# Patient Record
Sex: Male | Born: 1980
Health system: Southern US, Community
[De-identification: ages and names within clinical notes are randomized; demographics above are authoritative.]

## PROBLEM LIST (undated history)

## (undated) DIAGNOSIS — R41840 Attention and concentration deficit: Secondary | ICD-10-CM

## (undated) DIAGNOSIS — B009 Herpesviral infection, unspecified: Secondary | ICD-10-CM

## (undated) DIAGNOSIS — M25531 Pain in right wrist: Secondary | ICD-10-CM

## (undated) DIAGNOSIS — E7800 Pure hypercholesterolemia, unspecified: Secondary | ICD-10-CM

## (undated) DIAGNOSIS — M7918 Myalgia, other site: Secondary | ICD-10-CM

## (undated) DIAGNOSIS — I700 Atherosclerosis of aorta: Secondary | ICD-10-CM

## (undated) DIAGNOSIS — N62 Hypertrophy of breast: Secondary | ICD-10-CM

## (undated) DIAGNOSIS — F909 Attention-deficit hyperactivity disorder, unspecified type: Secondary | ICD-10-CM

## (undated) DIAGNOSIS — B351 Tinea unguium: Secondary | ICD-10-CM

## (undated) DIAGNOSIS — K219 Gastro-esophageal reflux disease without esophagitis: Secondary | ICD-10-CM

## (undated) DIAGNOSIS — E669 Obesity, unspecified: Secondary | ICD-10-CM

## (undated) HISTORY — DX: Pure hypercholesterolemia, unspecified: E78.00

## (undated) HISTORY — PX: NO PAST SURGERIES: SHX2092

## (undated) HISTORY — DX: Obesity, unspecified: E66.9

## (undated) HISTORY — DX: Tinea unguium: B35.1

## (undated) HISTORY — DX: Herpesviral infection, unspecified: B00.9

## (undated) HISTORY — DX: Hypertrophy of breast: N62

## (undated) HISTORY — DX: Atherosclerosis of aorta: I70.0

## (undated) HISTORY — DX: Myalgia, other site: M79.18

## (undated) HISTORY — DX: Attention and concentration deficit: R41.840

## (undated) HISTORY — DX: Attention-deficit hyperactivity disorder, unspecified type: F90.9

## (undated) HISTORY — DX: Gastro-esophageal reflux disease without esophagitis: K21.9

---

## 2000-02-17 ENCOUNTER — Emergency Department (HOSPITAL_COMMUNITY): Admission: EM | Admit: 2000-02-17 | Discharge: 2000-02-17 | Payer: Self-pay | Admitting: Emergency Medicine

## 2000-02-17 ENCOUNTER — Encounter: Payer: Self-pay | Admitting: Emergency Medicine

## 2008-03-05 ENCOUNTER — Emergency Department (HOSPITAL_COMMUNITY): Admission: EM | Admit: 2008-03-05 | Discharge: 2008-03-05 | Payer: Self-pay | Admitting: Emergency Medicine

## 2008-03-08 ENCOUNTER — Emergency Department (HOSPITAL_COMMUNITY): Admission: EM | Admit: 2008-03-08 | Discharge: 2008-03-08 | Payer: Self-pay | Admitting: Family Medicine

## 2008-10-11 ENCOUNTER — Emergency Department (HOSPITAL_COMMUNITY): Admission: EM | Admit: 2008-10-11 | Discharge: 2008-10-11 | Payer: Self-pay | Admitting: Emergency Medicine

## 2010-08-25 ENCOUNTER — Emergency Department (HOSPITAL_BASED_OUTPATIENT_CLINIC_OR_DEPARTMENT_OTHER): Admission: EM | Admit: 2010-08-25 | Discharge: 2010-08-26 | Payer: Self-pay | Admitting: Emergency Medicine

## 2010-08-25 ENCOUNTER — Ambulatory Visit: Payer: Self-pay | Admitting: Radiology

## 2011-03-27 NOTE — Op Note (Signed)
[place]  Patient:    Randy Craig, Randy Craig                         MRN: [id number] Proc. Date: 02/17/00 Adm. Date:  [id number] Attending:  Lorre Nick                           Operative Report  PREOPERATIVE DIAGNOSIS:  Laceration of extensor tendons, middle, ring, little finger of right hand.  POSTOPERATIVE DIAGNOSIS:  Laceration of extensor tendons, middle, ring, little finger of right hand.  OPERATION PERFORMED:  Repair of extensor tendons of middle, ring and little fingers.  SURGEON:  Nicki Reaper, M.D.  ASSISTANT:  RN.  ANESTHESIA:  Local.  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  He was seen at the request of the emergency room physician.  Physical examination reveals lacerations.  X-rays reveal several small foreign bodies.  DESCRIPTION OF PROCEDURE:  The patient was prepped and draped using Betadine scrub and solution.  A tourniquet placed on the arm was inflated 250 mmHg. The wound was opened, inspected.  The foreign material was not able to be found.  The extensor tendon lacerations to the middle, ring and little finger were identified.  The stumps were delivered out proximally from the distal aspect and distally from the proximal aspect without removal of the retinacular fibers.  A repair was performed with modified Kessler using 4-0 Mersilene and figure-of-eight epitenon sutures.  The wound was irrigated. Skin closed with interrupted 5-0 nylon sutures.  This was a large laceration covering the entire dorsal aspect down to the ulnar side of his hand and then distally.  A sterile compressive dressing and splint applied.  The patient tolerated the procedure well.  He is discharged home to return to the [facility] in one week on Vicodin and Keflex. DD:   02/17/00 TD:  02/18/00 Job: [id number]

## 2011-08-14 LAB — POCT RAPID STREP A: Streptococcus, Group A Screen (Direct): NEGATIVE

## 2013-05-01 ENCOUNTER — Telehealth: Payer: Self-pay

## 2013-05-01 NOTE — Telephone Encounter (Signed)
Is this WC? If it is please take message in [place]

## 2013-05-01 NOTE — Telephone Encounter (Signed)
Verlee Monte (physical therapist with [facility] called about an order for this pt regarding a specific therapy and medication combination. Based on pts insurance they need a specific order placed/signed. States they faxed this also.  AV:[phone] Fax: [phone]  bf

## 2013-08-02 ENCOUNTER — Other Ambulatory Visit: Payer: Self-pay | Admitting: Interventional Cardiology

## 2013-08-02 DIAGNOSIS — Z79899 Other long term (current) drug therapy: Secondary | ICD-10-CM

## 2013-08-02 DIAGNOSIS — E785 Hyperlipidemia, unspecified: Secondary | ICD-10-CM

## 2013-08-25 ENCOUNTER — Other Ambulatory Visit: Payer: Self-pay | Admitting: Family Medicine

## 2013-08-25 DIAGNOSIS — N6320 Unspecified lump in the left breast, unspecified quadrant: Secondary | ICD-10-CM

## 2013-09-06 ENCOUNTER — Other Ambulatory Visit: Payer: Self-pay

## 2013-09-09 DIAGNOSIS — M25531 Pain in right wrist: Secondary | ICD-10-CM

## 2013-09-09 HISTORY — DX: Pain in right wrist: M25.531

## 2013-09-22 ENCOUNTER — Other Ambulatory Visit: Payer: Self-pay

## 2013-10-02 ENCOUNTER — Other Ambulatory Visit: Payer: Self-pay | Admitting: Orthopedic Surgery

## 2013-10-02 ENCOUNTER — Encounter (HOSPITAL_BASED_OUTPATIENT_CLINIC_OR_DEPARTMENT_OTHER): Payer: Self-pay | Admitting: *Deleted

## 2013-10-04 NOTE — H&P (Signed)
Randy Craig is an 32 y.o. male.   CC / Reason for Visit: Right wrist pain HPI: This patient returns for reevaluation, having last been evaluated on 08-03-13 and having undergone MRI arthrogram of the right wrist on 08-30-13.  The MRI arthrogram has some irregularity at the TFCC, although frank tear is not evident that there is no extravasation of contrast through the TFCC.  There may be a small central tear of the scapholunate ligament however.  He reports that his symptoms are back to the level that they were prior to injection and that he relies upon his brace intermittently.  Presenting history follows: This patient is a 32 year old male who presents for evaluation of his right wrist.  He reports that he was working with a resident, when the resident became angry and slammed the door and the patient's face.  He reports putting hand up to block the door causing his wrist to become hyperextended.  He reports at that time he had some pain along the thumb and the ulnar side of the wrist, and the thumb pain has resolved.  He had x-rays obtained and initial evaluation that day.  He has treated this nonoperatively and undergone therapy but continues to have ulnar-sided pain.  I did ask him about a scar on the dorsal ulnar aspect of the hand and he reports that he had an injury in 2001 that created a laceration and required some extensor tendon repair.  He is done well in the interim by his report.  Past Medical History  Diagnosis Date  . Right wrist pain 09/2013    Past Surgical History  Procedure Laterality Date  . No past surgeries      History reviewed. No pertinent family history. Social History:  reports that he has never smoked. He has never used smokeless tobacco. He reports that he drinks alcohol. He reports that he does not use illicit drugs.  Allergies: No Known Allergies  No prescriptions prior to admission    No results found for this or any previous visit (from the past 48  hour(s)). No results found.  Review of Systems  All other systems reviewed and are negative.    Height 6\' 3"  (1.905 m), weight 127.007 kg (280 lb). Physical Exam  Constitutional:  WD, WN, NAD HEENT:  NCAT, EOMI Neuro/Psych:  Alert & oriented to person, place, and time; appropriate mood & affect Lymphatic: No generalized UE edema or lymphadenopathy Extremities / MSK:  Both UE are normal with respect to appearance, ranges of motion, joint stability, muscle strength/tone, sensation, & perfusion except as otherwise noted:  There is a well-healed laceration that traverses the dorsal ulnar aspect of the hand near the junction with the carpus and around the ulnar side.  He has full extension of all the digits to include EDQ function.  Maximal tenderness today is just distal to the distal ulna.  LT ballottement does not cause pain.  No other areas of tenderness, and no pain specifically along the ECU.   Labs / Xrays:  No radiographic studies obtained today.  Assessment: Left wrist pain, possibly of TFCC origin, but also with scapholunate ligament abnormalities on MRI arthrogram  Plan:  I discussed these findings with him.  He confirms with me that his problems are sufficient for him to consider operative intervention.  I recommend proceeding with right wrist arthroscopy to further define the pathology, with subsequent debridement versus repair of structures as indicated.  We will take a good look at the scapholunate  ligament as well as the TFCC.  I anticipate finding TFCC pathology.  We will plan to proceed once authorization has been received from [employer].  Brady Schiller A. 10/04/2013, 3:27 PM

## 2013-10-09 ENCOUNTER — Encounter (HOSPITAL_BASED_OUTPATIENT_CLINIC_OR_DEPARTMENT_OTHER): Payer: Self-pay

## 2013-10-09 ENCOUNTER — Ambulatory Visit (HOSPITAL_BASED_OUTPATIENT_CLINIC_OR_DEPARTMENT_OTHER)
Admission: RE | Admit: 2013-10-09 | Discharge: 2013-10-09 | Disposition: A | Discharge status: Home / Self Care | Payer: Worker's Compensation | Source: Ambulatory Visit | Attending: Orthopedic Surgery | Admitting: Orthopedic Surgery

## 2013-10-09 ENCOUNTER — Encounter (HOSPITAL_BASED_OUTPATIENT_CLINIC_OR_DEPARTMENT_OTHER)
Admission: RE | Disposition: A | Discharge status: Home / Self Care | Payer: Self-pay | Source: Ambulatory Visit | Attending: Orthopedic Surgery

## 2013-10-09 ENCOUNTER — Ambulatory Visit (HOSPITAL_BASED_OUTPATIENT_CLINIC_OR_DEPARTMENT_OTHER): Payer: Worker's Compensation | Admitting: Certified Registered Nurse Anesthetist

## 2013-10-09 ENCOUNTER — Encounter (HOSPITAL_BASED_OUTPATIENT_CLINIC_OR_DEPARTMENT_OTHER): Payer: Worker's Compensation | Admitting: Certified Registered Nurse Anesthetist

## 2013-10-09 DIAGNOSIS — M249 Joint derangement, unspecified: Secondary | ICD-10-CM | POA: Insufficient documentation

## 2013-10-09 HISTORY — PX: WRIST ARTHROSCOPY WITH FOVEAL TRIANGULAR FIBROCARTILAGE COMPLEX REPAIR: SHX6403

## 2013-10-09 HISTORY — DX: Pain in right wrist: M25.531

## 2013-10-09 LAB — POCT HEMOGLOBIN-HEMACUE: Hemoglobin: 15.3 g/dL (ref 13.0–17.0)

## 2013-10-09 SURGERY — WRIST ARTHROSCOPY WITH FOVEAL TRIANGULAR FIBROCARTILAGE COMPLEX REPAIR
Anesthesia: General | Site: Wrist | Laterality: Left | Wound class: Clean

## 2013-10-09 MED ORDER — OXYCODONE-ACETAMINOPHEN 5-325 MG PO TABS: 1.0000 | ORAL_TABLET | ORAL | Status: DC | PRN

## 2013-10-09 MED ORDER — CEFAZOLIN SODIUM-DEXTROSE 2-3 GM-% IV SOLR
2.0000 g | INTRAVENOUS | Status: AC
  Administered 2013-10-09: 2 g via INTRAVENOUS

## 2013-10-09 MED ORDER — FENTANYL CITRATE 0.05 MG/ML IJ SOLN
50.0000 ug | INTRAMUSCULAR | Status: DC | PRN
  Administered 2013-10-09: 100 ug via INTRAVENOUS

## 2013-10-09 MED ORDER — MIDAZOLAM HCL 2 MG/2ML IJ SOLN
INTRAMUSCULAR | Status: AC
  Filled 2013-10-09: qty 2

## 2013-10-09 MED ORDER — CEFAZOLIN SODIUM-DEXTROSE 2-3 GM-% IV SOLR
INTRAVENOUS | Status: AC
  Filled 2013-10-09: qty 50

## 2013-10-09 MED ORDER — HYDROMORPHONE HCL PF 1 MG/ML IJ SOLN
INTRAMUSCULAR | Status: AC
  Filled 2013-10-09: qty 1

## 2013-10-09 MED ORDER — BUPIVACAINE-EPINEPHRINE 0.5% -1:200000 IJ SOLN
INTRAMUSCULAR | Status: DC | PRN
  Administered 2013-10-09: 10 mL

## 2013-10-09 MED ORDER — MIDAZOLAM HCL 5 MG/5ML IJ SOLN
INTRAMUSCULAR | Status: DC | PRN
  Administered 2013-10-09: 2 mg via INTRAVENOUS

## 2013-10-09 MED ORDER — ONDANSETRON HCL 4 MG/2ML IJ SOLN
INTRAMUSCULAR | Status: DC | PRN
  Administered 2013-10-09: 4 mg via INTRAVENOUS

## 2013-10-09 MED ORDER — FENTANYL CITRATE 0.05 MG/ML IJ SOLN
INTRAMUSCULAR | Status: AC
  Filled 2013-10-09: qty 6

## 2013-10-09 MED ORDER — OXYCODONE HCL 5 MG PO TABS
ORAL_TABLET | ORAL | Status: AC
  Filled 2013-10-09: qty 1

## 2013-10-09 MED ORDER — LACTATED RINGERS IV SOLN: INTRAVENOUS | Status: DC

## 2013-10-09 MED ORDER — CHLORHEXIDINE GLUCONATE 4 % EX LIQD: 60.0000 mL | Freq: Once | CUTANEOUS | Status: DC

## 2013-10-09 MED ORDER — LIDOCAINE HCL (CARDIAC) 20 MG/ML IV SOLN
INTRAVENOUS | Status: DC | PRN
  Administered 2013-10-09: 60 mg via INTRAVENOUS

## 2013-10-09 MED ORDER — HYDROMORPHONE HCL PF 1 MG/ML IJ SOLN
0.2500 mg | INTRAMUSCULAR | Status: DC | PRN
  Administered 2013-10-09 (×3): 0.5 mg via INTRAVENOUS

## 2013-10-09 MED ORDER — LACTATED RINGERS IV SOLN
INTRAVENOUS | Status: DC
  Administered 2013-10-09 (×2): via INTRAVENOUS

## 2013-10-09 MED ORDER — MIDAZOLAM HCL 2 MG/ML PO SYRP: 12.0000 mg | ORAL_SOLUTION | Freq: Once | ORAL | Status: DC | PRN

## 2013-10-09 MED ORDER — PROPOFOL 10 MG/ML IV BOLUS
INTRAVENOUS | Status: DC | PRN
  Administered 2013-10-09: 260 mg via INTRAVENOUS

## 2013-10-09 MED ORDER — OXYCODONE HCL 5 MG PO TABS
5.0000 mg | ORAL_TABLET | Freq: Once | ORAL | Status: AC | PRN
  Administered 2013-10-09: 5 mg via ORAL

## 2013-10-09 MED ORDER — FENTANYL CITRATE 0.05 MG/ML IJ SOLN
INTRAMUSCULAR | Status: DC | PRN
  Administered 2013-10-09 (×2): 50 ug via INTRAVENOUS
  Administered 2013-10-09: 100 ug via INTRAVENOUS
  Administered 2013-10-09 (×2): 50 ug via INTRAVENOUS

## 2013-10-09 MED ORDER — OXYCODONE HCL 5 MG/5ML PO SOLN: 5.0000 mg | Freq: Once | ORAL | Status: AC | PRN

## 2013-10-09 MED ORDER — ONDANSETRON HCL 4 MG/2ML IJ SOLN: 4.0000 mg | Freq: Once | INTRAMUSCULAR | Status: DC | PRN

## 2013-10-09 MED ORDER — MIDAZOLAM HCL 2 MG/2ML IJ SOLN
1.0000 mg | INTRAMUSCULAR | Status: DC | PRN
  Administered 2013-10-09: 2 mg via INTRAVENOUS

## 2013-10-09 MED ORDER — PROPOFOL 10 MG/ML IV BOLUS
INTRAVENOUS | Status: AC
  Filled 2013-10-09: qty 20

## 2013-10-09 MED ORDER — FENTANYL CITRATE 0.05 MG/ML IJ SOLN
INTRAMUSCULAR | Status: AC
  Filled 2013-10-09: qty 2

## 2013-10-09 MED ORDER — FENTANYL CITRATE 0.05 MG/ML IJ SOLN
INTRAMUSCULAR | Status: AC
  Filled 2013-10-09: qty 4

## 2013-10-09 MED ORDER — DEXAMETHASONE SODIUM PHOSPHATE 10 MG/ML IJ SOLN
INTRAMUSCULAR | Status: DC | PRN
  Administered 2013-10-09: 10 mg via INTRAVENOUS

## 2013-10-09 SURGICAL SUPPLY — 67 items
ADH SKN CLS APL DERMABOND .7 (GAUZE/BANDAGES/DRESSINGS)
APL SKNCLS STERI-STRIP NONHPOA (GAUZE/BANDAGES/DRESSINGS)
BANDAGE GAUZE ELAST BULKY 4 IN (GAUZE/BANDAGES/DRESSINGS) ×4 IMPLANT
BENZOIN TINCTURE PRP APPL 2/3 (GAUZE/BANDAGES/DRESSINGS) IMPLANT
BLADE MINI RND TIP GREEN BEAV (BLADE) IMPLANT
BLADE SURG 15 STRL LF DISP TIS (BLADE) ×1 IMPLANT
BLADE SURG 15 STRL SS (BLADE) ×2
BNDG CMPR 9X4 STRL LF SNTH (GAUZE/BANDAGES/DRESSINGS) ×1
BNDG COHESIVE 4X5 TAN STRL (GAUZE/BANDAGES/DRESSINGS) ×2 IMPLANT
BNDG ESMARK 4X9 LF (GAUZE/BANDAGES/DRESSINGS) ×2 IMPLANT
BUR CUDA 2.9 (BURR) IMPLANT
BUR FULL RADIUS 2.0 (BURR) IMPLANT
BUR FULL RADIUS 2.9 (BURR) IMPLANT
BUR GATOR 2.9 (BURR) ×1 IMPLANT
CANISTER SUCT 1200ML W/VALVE (MISCELLANEOUS) ×2 IMPLANT
CANISTER SUCT 3000ML (MISCELLANEOUS) ×1 IMPLANT
CANISTER SUCT LVC 12 LTR MEDI- (MISCELLANEOUS) IMPLANT
CHLORAPREP W/TINT 26ML (MISCELLANEOUS) ×2 IMPLANT
CORDS BIPOLAR (ELECTRODE) IMPLANT
COVER MAYO STAND STRL (DRAPES) ×1 IMPLANT
COVER TABLE BACK 60X90 (DRAPES) ×2 IMPLANT
CUFF TOURNIQUET SINGLE 18IN (TOURNIQUET CUFF) ×1 IMPLANT
DECANTER SPIKE VIAL GLASS SM (MISCELLANEOUS) IMPLANT
DERMABOND ADVANCED (GAUZE/BANDAGES/DRESSINGS)
DERMABOND ADVANCED .7 DNX12 (GAUZE/BANDAGES/DRESSINGS) IMPLANT
DRAPE EXTREMITY T 121X128X90 (DRAPE) ×2 IMPLANT
DRAPE OEC MINIVIEW 54X84 (DRAPES) IMPLANT
DRAPE SURG 17X23 STRL (DRAPES) ×2 IMPLANT
DRSG EMULSION OIL 3X3 NADH (GAUZE/BANDAGES/DRESSINGS) ×2 IMPLANT
DRSG TEGADERM 4X4.75 (GAUZE/BANDAGES/DRESSINGS) IMPLANT
GLOVE BIO SURGEON STRL SZ7.5 (GLOVE) ×3 IMPLANT
GLOVE BIOGEL PI IND STRL 7.0 (GLOVE) IMPLANT
GLOVE BIOGEL PI IND STRL 8 (GLOVE) ×1 IMPLANT
GLOVE BIOGEL PI INDICATOR 7.0 (GLOVE) ×1
GLOVE BIOGEL PI INDICATOR 8 (GLOVE) ×1
GLOVE ECLIPSE 6.5 STRL STRAW (GLOVE) ×1 IMPLANT
GOWN PREVENTION PLUS XLARGE (GOWN DISPOSABLE) ×4 IMPLANT
NEEDLE HYPO 22GX1.5 SAFETY (NEEDLE) ×2 IMPLANT
PACK BASIN DAY SURGERY FS (CUSTOM PROCEDURE TRAY) ×2 IMPLANT
PADDING CAST ABS 4INX4YD NS (CAST SUPPLIES) ×1
PADDING CAST ABS COTTON 4X4 ST (CAST SUPPLIES) ×1 IMPLANT
PASSER SUT SWANSON 36MM LOOP (INSTRUMENTS) IMPLANT
SET SM JOINT TUBING/CANN (CANNULA) ×2 IMPLANT
SHEET MEDIUM DRAPE 40X70 STRL (DRAPES) ×1 IMPLANT
SLING ARM FOAM STRAP LRG (SOFTGOODS) IMPLANT
SLING ARM FOAM STRAP MED (SOFTGOODS) IMPLANT
SLING ARM FOAM STRAP SML (SOFTGOODS) IMPLANT
SLING ARM FOAM STRAP XLG (SOFTGOODS) IMPLANT
SPONGE GAUZE 4X4 12PLY (GAUZE/BANDAGES/DRESSINGS) ×2 IMPLANT
STOCKINETTE 4X48 STRL (DRAPES) ×2 IMPLANT
STRIP CLOSURE SKIN 1/2X4 (GAUZE/BANDAGES/DRESSINGS) IMPLANT
SUT ETHIBOND 2 OS 4 DA (SUTURE) IMPLANT
SUT LASSO 70 DEG MICRO SHORT (SUTURE) ×2
SUT LASSO MICRO STR (SUTURE) ×1 IMPLANT
SUT PDS AB 0 CT 36 (SUTURE) ×2 IMPLANT
SUT VICRYL 4-0 PS2 18IN ABS (SUTURE) ×1 IMPLANT
SUT VICRYL RAPIDE 4/0 PS 2 (SUTURE) IMPLANT
SUTURE LASSO 70 DEG MICR SHORT (SUTURE) IMPLANT
SYR BULB 3OZ (MISCELLANEOUS) IMPLANT
SYR CONTROL 10ML LL (SYRINGE) ×1 IMPLANT
SYRINGE 10CC LL (SYRINGE) IMPLANT
TOWEL OR 17X24 6PK STRL BLUE (TOWEL DISPOSABLE) ×2 IMPLANT
TOWEL OR NON WOVEN STRL DISP B (DISPOSABLE) ×2 IMPLANT
TRAP DIGIT (INSTRUMENTS) ×1 IMPLANT
TRAP FINGER LRG (INSTRUMENTS) IMPLANT
TUBE CONNECTING 20X1/4 (TUBING) ×2 IMPLANT
WAND 1.5 MICROBLATOR (SURGICAL WAND) IMPLANT

## 2013-10-09 NOTE — Anesthesia Procedure Notes (Signed)
Procedure Name: LMA Insertion Date/Time: 10/09/2013 10:21 AM Performed by: Janee Morn, DAVID A Pre-anesthesia Checklist: Patient identified, Emergency Drugs available, Suction available and Patient being monitored Patient Re-evaluated:Patient Re-evaluated prior to inductionOxygen Delivery Method: Circle System Utilized Preoxygenation: Pre-oxygenation with 100% oxygen Intubation Type: IV induction Ventilation: Mask ventilation without difficulty LMA: LMA inserted LMA Size: 5.0 Number of attempts: 1 Placement Confirmation: positive ETCO2 Tube secured with: Tape Dental Injury: Teeth and Oropharynx as per pre-operative assessment

## 2013-10-09 NOTE — Anesthesia Preprocedure Evaluation (Signed)
Anesthesia Evaluation  Patient identified by MRN, date of birth, ID band Patient awake    Reviewed: Allergy & Precautions, H&P , NPO status , Patient's Chart, lab work & pertinent test results  Airway Mallampati: I      Dental  (+) Teeth Intact and Dental Advisory Given   Pulmonary  breath sounds clear to auscultation        Cardiovascular Rhythm:Regular Rate:Normal     Neuro/Psych    GI/Hepatic   Endo/Other    Renal/GU      Musculoskeletal   Abdominal   Peds  Hematology   Anesthesia Other Findings   Reproductive/Obstetrics                           Anesthesia Physical Anesthesia Plan  ASA: I  Anesthesia Plan: General   Post-op Pain Management:    Induction: Intravenous  Airway Management Planned: Oral ETT  Additional Equipment:   Intra-op Plan:   Post-operative Plan: Extubation in OR  Informed Consent: I have reviewed the patients History and Physical, chart, labs and discussed the procedure including the risks, benefits and alternatives for the proposed anesthesia with the patient or authorized representative who has indicated his/her understanding and acceptance.   Dental advisory given  Plan Discussed with: CRNA, Anesthesiologist and Surgeon  Anesthesia Plan Comments:         Anesthesia Quick Evaluation

## 2013-10-09 NOTE — Op Note (Signed)
10/09/2013  9:59 AM  PATIENT:  Randy Craig  32 y.o. 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 Janee Morn, MD  PHYSICIAN ASSISTANT: None  ANESTHESIA:  general  SPECIMENS:  None  DRAINS:   None  PREOPERATIVE INDICATIONS:  Randy Craig is a  32 y.o. male with a diagnosis of right wrist pain who failed conservative measures and elected for surgical management.    The risks benefits and alternatives were discussed with the patient preoperatively including but not limited to the risks of infection, bleeding, nerve injury, cardiopulmonary complications, the need for revision surgery, among others, and the patient verbalized understanding and consented to proceed.  OPERATIVE IMPLANTS: None  OPERATIVE PROCEDURE:  After receiving prophylactic antibiotics, the patient was escorted to the operative theatre and placed in a supine position.  General anesthesia was administered A surgical "time-out" was performed during which the planned procedure, proposed operative site, and the correct patient identity were compared to the operative consent and agreement confirmed by the circulating nurse according to current facility policy.  Following application of a tourniquet to the operative extremity, the exposed skin was prepped with Chloraprep and draped in the usual sterile fashion.  The limb was exsanguinated with an Esmarch bandage and the tourniquet inflated to approximately higher than systolic BP.  Right upper extremity was positioned in the Linvatec traction tower and the portals marked after 10-12 pounds of traction was applied. The 3-4 portal was established first with 15 blade,  followed by a mosquito clamp and the blunt obturator. Visualization was good. The cartilage of the proximal row was unremarkable. Cartilage of the distal radius had no lesions. The central portion of the scapholunate ligament was found to have been torn and disrupted with intact volar and dorsal components. A dorsal peripheral TFCC tear was also identified after establishment of a 6R portal and use of a probe. There was synovitis in the prestyloid recess and along the dorsal peripheral capsule. The scope was then positioned into the 6R portal and used for visualization while the suction shaver was introduced into the 3-4 portal and the central portion of the scapholunate ligament debrided. The radial midcarpal joint was then established and the camera inserted here. There was no significant gapping of the scapholunate joint and the tip of the probe could not be passed into the joint, after establishment of the ulnar midcarpal portal. Attention was directed back to the radiocarpal joint for visualization was maintained through the 3-4 portal and a 6R portal was elongated with care to protect the dorsal cutaneous nerve distally. The fifth compartment was opened and an [employer] TFC repair kit used to pass 2 simple sutures through the TFCC and tied down over the dorsal capsule. The fifth compartment was then closed with 3-0 Vicryl interrupted sutures allowing fifth compartment tendon to remain extra compartmental distally and within screws proximally. Tourniquet was released and additional hemostasis and necessary and the portals anesthetized with quarter percent Marcaine with epinephrine. All the incisions were closed with 4-0 Vicryl Rapide interrupted sutures, running horizontal mattress for the longer incision and a sugar tong splint dressing applied with the forearm semisupinated. He was awakened and taken to recovery room in stable condition.  DISPOSITION: Patient will be discharged home today  with typical  postop instructions returning in 10-15 days for reevaluation and with a follow-on appointment with hand therapy to have a long-arm splint constructed to keep the forearm semisupinated initially as he embarked upon a Advertising account executive protocol.

## 2013-10-09 NOTE — Progress Notes (Signed)
Assisted Dr. Crews with right, ultrasound guided, supraclavicular block. Side rails up, monitors on throughout procedure. See vital signs in flow sheet. Tolerated Procedure well. 

## 2013-10-09 NOTE — Interval H&P Note (Signed)
History and Physical Interval Note:  10/09/2013 9:58 AM  Randy Craig  has presented today for surgery, with the diagnosis of RIGHT WRIST PAIN   The various methods of treatment have been discussed with the patient and family. After consideration of risks, benefits and other options for treatment, the patient has consented to  Procedure(s): RIGHT WRIST ARTHROSCOPY POSSIBLE LIGAMENT REPAIR     (Left) as a surgical intervention .  The patient's history has been reviewed, patient examined, no change in status, stable for surgery.  I have reviewed the patient's chart and labs.  Questions were answered to the patient's satisfaction.     Hussam Muniz A.

## 2013-10-09 NOTE — Anesthesia Postprocedure Evaluation (Signed)
  Anesthesia Post-op Note  Patient: Randy Craig  Procedure(s) Performed: Procedure(s): RIGHT WRIST ARTHROSCOPY TFCC REPAIR     (Left)  Patient Location: PACU  Anesthesia Type:General  Level of Consciousness: awake, alert  and oriented  Airway and Oxygen Therapy: Patient Spontanous Breathing  Post-op Pain: mild  Post-op Assessment: Post-op Vital signs reviewed  Post-op Vital Signs: Reviewed  Complications: No apparent anesthesia complications

## 2013-10-09 NOTE — Transfer of Care (Signed)
Immediate Anesthesia Transfer of Care Note  Patient: Randy Craig  Procedure(s) Performed: Procedure(s): RIGHT WRIST ARTHROSCOPY TFCC REPAIR     (Left)  Patient Location: PACU  Anesthesia Type:General  Level of Consciousness: awake and patient cooperative  Airway & Oxygen Therapy: Patient Spontanous Breathing and Patient connected to face mask oxygen  Post-op Assessment: Report given to PACU RN and Post -op Vital signs reviewed and stable  Post vital signs: Reviewed and stable  Complications: No apparent anesthesia complications

## 2013-10-10 ENCOUNTER — Encounter (HOSPITAL_BASED_OUTPATIENT_CLINIC_OR_DEPARTMENT_OTHER): Payer: Self-pay | Admitting: Orthopedic Surgery

## 2014-05-21 ENCOUNTER — Ambulatory Visit (INDEPENDENT_AMBULATORY_CARE_PROVIDER_SITE_OTHER): Payer: BC Managed Care – PPO | Admitting: Surgery

## 2014-05-21 ENCOUNTER — Encounter (INDEPENDENT_AMBULATORY_CARE_PROVIDER_SITE_OTHER): Payer: Self-pay | Admitting: Surgery

## 2014-05-21 VITALS — BP 122/80 | HR 68 | Resp 14 | Ht 75.0 in | Wt 276.8 lb

## 2014-05-21 DIAGNOSIS — K4090 Unilateral inguinal hernia, without obstruction or gangrene, not specified as recurrent: Secondary | ICD-10-CM

## 2014-05-21 NOTE — Patient Instructions (Signed)
Hernia Repair Care After These instructions give you information on caring for yourself after your procedure. Your doctor may also give you more specific instructions. Call your doctor if you have any problems or questions after your procedure. HOME CARE   You may have changes in your poops (bowel movements).  You may have loose or watery poop (diarrhea).  You may be not able to poop.  Your bowels will slowly get back to normal.  Do not eat any food that makes you sick to your stomach (nauseous). Eat small meals 4 to 6 times a day instead of 3 large ones.  Do not drink pop. It will give you gas.  Do not drink alcohol.  Do not lift anything heavier than 10 pounds. This is about the weight of a gallon of milk.  Do not do anything that makes you very tired for at least 6 weeks.  Do not get your wound wet for 2 days.  You may take a sponge bath during this time.  After 2 days you may take a shower. Gently pat your surgical cut (incision) dry with a towel. Do not rub it.  For men: You may have been given an athletic supporter (scrotal support) before you left the hospital. It holds your scrotum and testicles closer to your body so there is no strain on your wound. Wear the supporter until your doctor tells you that you do not need it anymore. GET HELP RIGHT AWAY IF:  You have watery poop, or cannot poop for more than 3 days.  You feel sick to your stomach or throw up (vomit) more than 2 or 3 times.  You have temperature by mouth above 102 F (38.9 C).  You see redness or puffiness (swelling) around your wound.  You see yellowish white fluid (pus) coming from your wound.  You see a bulge or bump in your lower belly (abdomen) or near your groin.  You develop a rash, trouble breathing, or any other symptoms from medicines taken. MAKE SURE YOU:  Understand these instructions.  Will watch your condition.  Will get help right away if your are not doing well or get  worse. Document Released: 10/08/2008 Document Revised: 01/18/2012 Document Reviewed: 10/08/2008 [facility] Patient Information 2015 [place], [place]. This information is not intended to replace advice given to you by your health care provider. Make sure you discuss any questions you have with your health care provider. Inguinal Hernia, Adult Muscles help keep everything in the body in its proper place. But if a weak spot in the muscles develops, something can poke through. That is called a hernia. When this happens in the lower part of the belly (abdomen), it is called an inguinal hernia. (It takes its name from a part of the body in this region called the inguinal canal.) A weak spot in the wall of muscles lets some fat or part of the small intestine bulge through. An inguinal hernia can develop at any age. Men get them more often than women. CAUSES  In adults, an inguinal hernia develops over time.  It can be triggered by:  Suddenly straining the muscles of the lower abdomen.  Lifting heavy objects.  Straining to have a bowel movement. Difficult bowel movements (constipation) can lead to this.  Constant coughing. This may be caused by smoking or lung disease.  Being overweight.  Being pregnant.  Working at a job that requires long periods of standing or heavy lifting.  Having had an inguinal hernia before.  One type can be an emergency situation. It is called a strangulated inguinal hernia. It develops if part of the small intestine slips through the weak spot and cannot get back into the abdomen. The blood supply can be cut off. If that happens, part of the intestine may die. This situation requires emergency surgery. SYMPTOMS  Often, a small inguinal hernia has no symptoms. It is found when a healthcare provider does a physical exam. Larger hernias usually have symptoms.   In adults, symptoms may include:  A lump in the groin. This is easier to see when the person is standing. It might  disappear when lying down.  In men, a lump in the scrotum.  Pain or burning in the groin. This occurs especially when lifting, straining or coughing.  A dull ache or feeling of pressure in the groin.  Signs of a strangulated hernia can include:  A bulge in the groin that becomes very painful and tender to the touch.  A bulge that turns red or purple.  Fever, nausea and vomiting.  Inability to have a bowel movement or to pass gas. DIAGNOSIS  To decide if you have an inguinal hernia, a healthcare provider will probably do a physical examination.  This will include asking questions about any symptoms you have noticed.  The healthcare provider might feel the groin area and ask you to cough. If an inguinal hernia is felt, the healthcare provider may try to slide it back into the abdomen.  Usually no other tests are needed. TREATMENT  Treatments can vary. The size of the hernia makes a difference. Options include:  Watchful waiting. This is often suggested if the hernia is small and you have had no symptoms.  No medical procedure will be done unless symptoms develop.  You will need to watch closely for symptoms. If any occur, contact your healthcare provider right away.  Surgery. This is used if the hernia is larger or you have symptoms.  Open surgery. This is usually an outpatient procedure (you will not stay overnight in a hospital). An cut (incision) is made through the skin in the groin. The hernia is put back inside the abdomen. The weak area in the muscles is then repaired by herniorrhaphy or hernioplasty. Herniorrhaphy: in this type of surgery, the weak muscles are sewn back together. Hernioplasty: a patch or mesh is used to close the weak area in the abdominal wall.  Laparoscopy. In this procedure, a surgeon makes small incisions. A thin tube with a tiny video camera (called a laparoscope) is put into the abdomen. The surgeon repairs the hernia with mesh by looking with the  video camera and using two long instruments. HOME CARE INSTRUCTIONS   After surgery to repair an inguinal hernia:  You will need to take pain medicine prescribed by your healthcare provider. Follow all directions carefully.  You will need to take care of the wound from the incision.  Your activity will be restricted for awhile. This will probably include no heavy lifting for several weeks. You also should not do anything too active for a few weeks. When you can return to work will depend on the type of job that you have.  During "watchful waiting" periods, you should:  Maintain a healthy weight.  Eat a diet high in fiber (fruits, vegetables and whole grains).  Drink plenty of fluids to avoid constipation. This means drinking enough water and other liquids to keep your urine clear or pale yellow.  Do not lift  heavy objects.  Do not stand for long periods of time.  Quit smoking. This should keep you from developing a frequent cough. SEEK MEDICAL CARE IF:   A bulge develops in your groin area.  You feel pain, a burning sensation or pressure in the groin. This might be worse if you are lifting or straining.  You develop a fever of more than 100.5 F (38.1 C). SEEK IMMEDIATE MEDICAL CARE IF:   Pain in the groin increases suddenly.  A bulge in the groin gets bigger suddenly and does not go down.  For men, there is sudden pain in the scrotum. Or, the size of the scrotum increases.  A bulge in the groin area becomes red or purple and is painful to touch.  You have nausea or vomiting that does not go away.  You feel your heart beating much faster than normal.  You cannot have a bowel movement or pass gas.  You develop a fever of more than 102.0 F (38.9 C). Document Released: 03/14/2009 Document Revised: 01/18/2012 Document Reviewed: 03/14/2009 [facility] Patient Information 2015 [place], [place]. This information is not intended to replace advice given to you by your health  care provider. Make sure you discuss any questions you have with your health care provider.

## 2014-05-21 NOTE — Progress Notes (Signed)
Patient ID: Randy Craig, male   DOB: 11/22/1980, 33 y.o.   MRN: [id number]  No chief complaint on file.   HPI Randy Craig is a 33 y.o. male.  Pt sent at the request of  Dr Foy GuadalajaraFried for left inguinal hernia.  Pt has a bulge and pain with exertion fo r2 months.  Rest makes it better.  HPI  Past Medical History  Diagnosis Date  . Right wrist pain 09/2013    Past Surgical History  Procedure Laterality Date  . No past surgeries    . Wrist arthroscopy with foveal triangular fibrocartilage complex repair Left 10/09/2013    Procedure: RIGHT WRIST ARTHROSCOPY TFCC REPAIR    ;  Surgeon: Jodi Marbleavid A Thompson, MD;  Location: [facility];  Service: Orthopedics;  Laterality: Left;    No family history on file.  Social History History  Substance Use Topics  . Smoking status: Never Smoker   . Smokeless tobacco: Never Used  . Alcohol Use: Yes     Comment: occasionally    No Known Allergies  Current Outpatient Prescriptions  Medication Sig Dispense Refill  . naproxen (NAPROSYN) 250 MG tablet Take by mouth 2 (two) times daily with a meal.       No current facility-administered medications for this visit.    Review of Systems Review of Systems  Constitutional: Negative for fever, chills and unexpected weight change.  HENT: Negative for congestion, hearing loss, sore throat, trouble swallowing and voice change.   Eyes: Negative for visual disturbance.  Respiratory: Negative for cough and wheezing.   Cardiovascular: Negative for chest pain, palpitations and leg swelling.  Gastrointestinal: Negative for nausea, vomiting, abdominal pain, diarrhea, constipation, blood in stool, abdominal distention, anal bleeding and rectal pain.  Genitourinary: Negative for hematuria and difficulty urinating.  Musculoskeletal: Negative for arthralgias.  Skin: Negative for rash and wound.  Neurological: Negative for seizures, syncope, weakness and headaches.  Hematological: Negative for adenopathy.  Does not bruise/bleed easily.  Psychiatric/Behavioral: Negative for confusion.    Blood pressure 122/80, pulse 68, resp. rate 14, height 6\' 3"  (1.905 m), weight 276 lb 12.8 oz (125.556 kg).  Physical Exam Physical Exam  Constitutional: He is oriented to person, place, and time. He appears well-developed and well-nourished.  HENT:  Head: Normocephalic and atraumatic.  Eyes: Pupils are equal, round, and reactive to light. No scleral icterus.  Neck: Normal range of motion. Neck supple.  Cardiovascular: Normal rate and regular rhythm.   Pulmonary/Chest: Effort normal and breath sounds normal.  Abdominal: Soft. There is no tenderness. A hernia is present. Hernia confirmed positive in the left inguinal area.  Musculoskeletal: Normal range of motion.  Neurological: He is alert and oriented to person, place, and time.  Skin: Skin is warm and dry.  Psychiatric: He has a normal mood and affect. His behavior is normal. Judgment and thought content normal.    Data Reviewed none  Assessment    Left inguinal hernia    Plan    Recommend repair left inguinal hernia with mesh.The risk of hernia repair include bleeding,  Infection,   Recurrence of the hernia,  Mesh use, chronic pain,  Organ injury,  Bowel injury,  Bladder injury,   nerve injury with numbness around the incision,  Death,  and worsening of preexisting  medical problems.  The alternatives to surgery have been discussed as well..  Long term expectations of both operative and non operative treatments have been discussed.   The patient agrees to proceed.  Stan Cantave A. 05/21/2014, 5:44 PM

## 2014-06-12 ENCOUNTER — Other Ambulatory Visit (INDEPENDENT_AMBULATORY_CARE_PROVIDER_SITE_OTHER): Payer: Self-pay

## 2014-06-12 DIAGNOSIS — K4090 Unilateral inguinal hernia, without obstruction or gangrene, not specified as recurrent: Secondary | ICD-10-CM

## 2014-06-12 MED ORDER — OXYCODONE-ACETAMINOPHEN 5-325 MG PO TABS: 1.0000 | ORAL_TABLET | ORAL | Status: DC | PRN

## 2014-06-12 MED ORDER — TRAMADOL HCL 50 MG PO TABS: 50.0000 mg | ORAL_TABLET | Freq: Four times a day (QID) | ORAL | Status: DC | PRN

## 2014-06-29 ENCOUNTER — Ambulatory Visit (INDEPENDENT_AMBULATORY_CARE_PROVIDER_SITE_OTHER): Payer: BC Managed Care – PPO | Admitting: Surgery

## 2014-06-29 ENCOUNTER — Encounter (INDEPENDENT_AMBULATORY_CARE_PROVIDER_SITE_OTHER): Payer: Self-pay | Admitting: Surgery

## 2014-06-29 VITALS — BP 130/78 | HR 77 | Temp 97.0°F | Ht 75.0 in | Wt 268.8 lb

## 2014-06-29 DIAGNOSIS — Z9889 Other specified postprocedural states: Secondary | ICD-10-CM

## 2014-06-29 NOTE — Patient Instructions (Signed)
Resume full activity in 2 weeks.  

## 2014-06-29 NOTE — Progress Notes (Signed)
Pt returns today after hernia repair.  Pain is well controlled.  Bowels are functioning.  Wound is clean.  On exam:  Incision is clean /dry/intact.  Area is soft without signs of hernia recurrence.  Impression:  Status repair of hernia  Plan:  RTC PRN  Return to work in  2  Weeks.

## 2017-08-30 ENCOUNTER — Ambulatory Visit: Payer: Self-pay | Admitting: Allergy and Immunology

## 2019-01-13 ENCOUNTER — Telehealth: Payer: Self-pay

## 2019-01-13 NOTE — Telephone Encounter (Signed)
Notes on file.  

## 2019-01-15 ENCOUNTER — Encounter: Payer: Self-pay | Admitting: Cardiology

## 2019-01-15 DIAGNOSIS — I2510 Atherosclerotic heart disease of native coronary artery without angina pectoris: Secondary | ICD-10-CM | POA: Insufficient documentation

## 2019-01-15 NOTE — Progress Notes (Addendum)
Cardiology Office Note    Date:  01/16/2019   ID:  Randy Craig, DOB 1981-09-05, MRN [id number]  PCP:  Marinda Elk, MD  Cardiologist:  Armanda Magic, MD   Chief Complaint  Patient presents with  . New Patient (Initial Visit)    Coronary artery calcifications on chest CT    History of Present Illness:  Randy Craig is a 38 y.o. male who is being seen today for the evaluation of coronary artery calcifications noted on CT scan at the request of Marinda Elk, MD.  This is a very pleasant 38 year old male with a history of ADHD, GERD hyperlipidemia was noted to have coronary artery calcifications on a recent chest CT.  He is here today for followup and is doing well.  He denies any chest pain or pressure, SOB, DOE, PND, orthopnea, LE edema, dizziness, palpitations or syncope. He is compliant with his meds and is tolerating meds with no SE.    Past Medical History:  Diagnosis Date  . ADHD   . Aortic atherosclerosis (HCC)   . Bilateral buttock pain   . GERD (gastroesophageal reflux disease)   . Gynecomastia, male   . Herpes simplex type 2 infection   . Inattention   . Obesity   . Onychomycosis   . Pure hypercholesterolemia   . Right wrist pain 09/2013    Past Surgical History:  Procedure Laterality Date  . NO PAST SURGERIES    . WRIST ARTHROSCOPY WITH FOVEAL TRIANGULAR FIBROCARTILAGE COMPLEX REPAIR Left 10/09/2013   Procedure: RIGHT WRIST ARTHROSCOPY TFCC REPAIR    ;  Surgeon: Jodi Marble, MD;  Location: [facility];  Service: Orthopedics;  Laterality: Left;    Current Medications: Current Meds  Medication Sig  . aspirin EC 81 MG tablet Take 81 mg by mouth daily.  . Fluticasone Furoate 50 MCG/ACT AEPB Inhale into the lungs.  . naproxen (NAPROSYN) 250 MG tablet Take by mouth 2 (two) times daily with a meal.  . rosuvastatin (CRESTOR) 40 MG tablet Take 40 mg by mouth daily.    Allergies:   Patient has no known allergies.   Social History    Socioeconomic History  . Marital status: Single    Spouse name: Not on file  . Number of children: Not on file  . Years of education: Not on file  . Highest education level: Not on file  Occupational History  . Not on file  Social Needs  . Financial resource strain: Not on file  . Food insecurity:    Worry: Not on file    Inability: Not on file  . Transportation needs:    Medical: Not on file    Non-medical: Not on file  Tobacco Use  . Smoking status: Never Smoker  . Smokeless tobacco: Never Used  Substance and Sexual Activity  . Alcohol use: Yes    Comment: occasionally  . Drug use: No  . Sexual activity: Not on file  Lifestyle  . Physical activity:    Days per week: Not on file    Minutes per session: Not on file  . Stress: Not on file  Relationships  . Social connections:    Talks on phone: Not on file    Gets together: Not on file    Attends religious service: Not on file    Active member of club or organization: Not on file    Attends meetings of clubs or organizations: Not on file    Relationship status: Not on  file  Other Topics Concern  . Not on file  Social History Narrative  . Not on file     Family History:  The patient's family history includes Heart disease in his maternal grandmother; Hyperlipidemia in his mother; Hypertension in his father.   ROS:   Please see the history of present illness.    ROS All other systems reviewed and are negative.  No flowsheet data found.     PHYSICAL EXAM:   VS:  BP 136/79   Pulse 76   Ht  (1.905 m)   Wt (!) 302 lb 9.6 oz (137.3 kg)   BMI 37.82 kg/m    GEN: Well nourished, well developed, in no acute distress  HEENT: normal  Neck: no JVD, carotid bruits, or masses Cardiac: RRR; no murmurs, rubs, or gallops,no edema.  Intact distal pulses bilaterally.  Respiratory:  clear to auscultation bilaterally, normal work of breathing GI: soft, nontender, nondistended, + BS MS: no deformity or atrophy   Skin: warm and dry, no rash Neuro:  Alert and Oriented x 3, Strength and sensation are intact Psych: euthymic mood, full affect  Wt Readings from Last 3 Encounters:  01/16/19 (!) 302 lb 9.6 oz (137.3 kg)  06/29/14 268 lb 12.8 oz (121.9 kg)  05/21/14 276 lb 12.8 oz (125.6 kg)      Studies/Labs Reviewed:   EKG:  EKG is not ordered today.    Recent Labs: No results found for requested labs within last 8760 hours.   Lipid Panel No results found for: CHOL, TRIG, HDL, CHOLHDL, VLDL, LDLCALC, LDLDIRECT  Additional studies/ records that were reviewed today include:  Office notes from PCP    ASSESSMENT:    1. Coronary artery calcification seen on CAT scan      PLAN:  In order of problems listed above:  1.  Coronary artery calcifications noted on chest CT -this was noted on a CT scan done in [facility] for a kidney stone.  I reviewed the CT which showed scattered atherosclerotic vascular calcifications more than typically seen in a patient of his age.  He has been having some episodic chest pain but it is very sporadic and sharp and only lasts a few seconds at a time.  There is no radiation and it is midsternal.  He denies any associated symptoms of shortness of breath, nausea or diaphoresis.  He does have a family history of CAD in his maternal grandmother who had CABG in her 62s.  He has never smoked and only other risk factors for CAD are hyperlipidemia. Recommend pursuing a chest CT for calcium score and getting an exercise treadmill test to rule out inducible ischemia and be able to assess future risk.   2.  Hyperlipidemia - his last LDL was 256 on 11/29/2018 and he is now on Crestor 40 mg daily.  This is followed by his PCP.  He has significant coronary calcium will need an LDL goal closer to less than 70.  3.  Atypical chest pain -refer to #1  Medication Adjustments/Labs and Tests Ordered: Current medicines are reviewed at length with the patient today.   Concerns regarding medicines are outlined above.  Medication changes, Labs and Tests ordered today are listed in the Patient Instructions below.  Patient Instructions  Medication Instructions:  Your physician recommends that you continue on your current medications as directed. Please refer to the Current Medication list given to you today.  If you need a refill on your cardiac  medications before your next appointment, please call your pharmacy.   Lab work: None If you have labs (blood work) drawn today and your tests are completely normal, you will receive your results only by: Marland Kitchen MyChart Message (if you have MyChart) OR . A paper copy in the mail If you have any lab test that is abnormal or we need to change your treatment, we will call you to review the results.  Testing/Procedures: Your physician has requested that you have an exercise tolerance test. For further information please visit [email or web address] Please also follow instruction sheet, as given.  Schedule a Calcium scoring test  Follow-Up: As needed after results.       Signed, Armanda Magic, MD  01/16/2019 10:22 AM    [facility] [address] [place], [place], [place]  [address] Phone: [phone]; Fax: [phone]

## 2019-01-16 ENCOUNTER — Encounter: Payer: Self-pay | Admitting: Cardiology

## 2019-01-16 ENCOUNTER — Ambulatory Visit (INDEPENDENT_AMBULATORY_CARE_PROVIDER_SITE_OTHER): Payer: BLUE CROSS/BLUE SHIELD | Admitting: Cardiology

## 2019-01-16 DIAGNOSIS — I2510 Atherosclerotic heart disease of native coronary artery without angina pectoris: Secondary | ICD-10-CM | POA: Diagnosis not present

## 2019-01-16 NOTE — Patient Instructions (Signed)
Medication Instructions:  Your physician recommends that you continue on your current medications as directed. Please refer to the Current Medication list given to you today.  If you need a refill on your cardiac medications before your next appointment, please call your pharmacy.   Lab work: None If you have labs (blood work) drawn today and your tests are completely normal, you will receive your results only by: Marland Kitchen MyChart Message (if you have MyChart) OR . A paper copy in the mail If you have any lab test that is abnormal or we need to change your treatment, we will call you to review the results.  Testing/Procedures: Your physician has requested that you have an exercise tolerance test. For further information please visit [email or web address] Please also follow instruction sheet, as given.  Schedule a Calcium scoring test  Follow-Up: As needed after results.

## 2019-01-31 ENCOUNTER — Telehealth: Payer: Self-pay

## 2019-01-31 NOTE — Telephone Encounter (Signed)
Spoke with the patient, he is fine to wait until May to have his stress test completed. He stated his chest discomfort has been consistent. I advised him if his symptoms worsened to call the office.

## 2019-01-31 NOTE — Telephone Encounter (Signed)
-----   Message from Quintella Reichert, MD sent at 01/30/2019  2:57 PM EDT ----- Regarding: RE: CT Marvel Sapp  Please find out how patient is feeling?  Traci ----- Message ----- From: Dustin Flock, RN Sent: 01/30/2019  12:24 PM EDT To: Quintella Reichert, MD Subject: CT                                             Can we reschedule?

## 2019-02-08 ENCOUNTER — Other Ambulatory Visit: Payer: Self-pay

## 2019-03-23 ENCOUNTER — Telehealth: Payer: Self-pay | Admitting: Cardiology

## 2019-03-23 NOTE — Telephone Encounter (Signed)
Due to mandatory government regulations rearding COVID-19, we are postponing all elective and non-urgent cardiac imaging procedures.   Randy Craig  is scheduled for an exercise stress test on 5/26.   I attempted to call patient but no answer.  ETT  was ordered to assess coronary artery calcifications.  Please postpone ETT until June.

## 2019-03-27 ENCOUNTER — Telehealth: Payer: Self-pay | Admitting: Cardiology

## 2019-03-27 NOTE — Telephone Encounter (Signed)
New Message  Called Pt at Dr. Norris Cross request to inform him that the ETT scheduled for 04-04-19 is cancelled and will be rescheduled in June RR:NHAFB 19. Pt is aware./renee val

## 2019-03-29 ENCOUNTER — Telehealth: Payer: Self-pay | Admitting: *Deleted

## 2019-03-29 NOTE — Telephone Encounter (Signed)
Called pt to RS CT CA Score appt LMTCB [phone]

## 2019-09-12 ENCOUNTER — Telehealth: Payer: Self-pay

## 2019-09-12 ENCOUNTER — Ambulatory Visit (INDEPENDENT_AMBULATORY_CARE_PROVIDER_SITE_OTHER)
Admission: RE | Admit: 2019-09-12 | Discharge: 2019-09-12 | Disposition: A | Discharge status: Home / Self Care | Payer: Self-pay | Source: Ambulatory Visit | Attending: Cardiology | Admitting: Cardiology

## 2019-09-12 ENCOUNTER — Other Ambulatory Visit: Payer: Self-pay

## 2019-09-12 DIAGNOSIS — E785 Hyperlipidemia, unspecified: Secondary | ICD-10-CM

## 2019-09-12 DIAGNOSIS — I2510 Atherosclerotic heart disease of native coronary artery without angina pectoris: Secondary | ICD-10-CM

## 2019-09-12 NOTE — Telephone Encounter (Signed)
Called patient back. Patient will come in tomorrow for FLP and ALT per Dr. Theodosia Blender advisement.

## 2019-09-12 NOTE — Telephone Encounter (Signed)
-----   Message from Sueanne Margarita, MD sent at 09/12/2019  9:25 AM EST ----- Please set up patient for ETT that was ordered in May

## 2019-09-12 NOTE — Telephone Encounter (Signed)
Notes recorded by Frederik Schmidt, RN on 09/12/2019 at 10:25 AM EST  The patient has been notified of the result and verbalized understanding. All questions (if any) were answered.  Frederik Schmidt, RN 09/12/2019 10:25 AM

## 2019-09-12 NOTE — Telephone Encounter (Signed)
-----   Message from Randy Margarita, MD sent at 09/12/2019  1:54 PM EST ----- Patient needs to  Come in for new FLP and ALT

## 2019-09-13 ENCOUNTER — Other Ambulatory Visit: Payer: BC Managed Care – PPO | Admitting: *Deleted

## 2019-09-13 DIAGNOSIS — E785 Hyperlipidemia, unspecified: Secondary | ICD-10-CM

## 2019-09-13 LAB — LIPID PANEL
Chol/HDL Ratio: 5.4 ratio — ABNORMAL HIGH (ref 0.0–5.0)
Cholesterol, Total: 285 mg/dL — ABNORMAL HIGH (ref 100–199)
HDL: 53 mg/dL (ref >39)
LDL Chol Calc (NIH): 220 mg/dL — ABNORMAL HIGH (ref 0–99)
Triglycerides: 77 mg/dL (ref 0–149)
VLDL Cholesterol Cal: 12 mg/dL (ref 5–40)

## 2019-09-13 LAB — ALT: ALT: 44 IU/L (ref 0–44)

## 2019-09-14 ENCOUNTER — Telehealth: Payer: Self-pay

## 2019-09-14 NOTE — Telephone Encounter (Signed)
Called pt to schedule lipid clinic appointment with PharmD. Referred by Dr. Radford Pax for consideration of PCSK9 inhibitor therapy. Of note, patient has not been taking Crestor for a while and states "it does not make me feel well." Will discuss this further at clinic visit. Scheduled appointment for 11/9 at 8:30am.

## 2019-09-17 NOTE — Progress Notes (Signed)
Patient ID: Randy Craig                 DOB: 07/27/1981                    MRN: [id number]     HPI: Randy Craig is a 38 y.o. male patient referred to lipid clinic by Dr. Radford Pax. PMH is significant for HLD, coronary artery calcifications noted on chest CT scan, and elevated coronary calcium score. He was last seen by Dr. Radford Pax on 01/16/19 where she had recommended the chest CT for calcium score and exercise treadmill test. The chest CT for calcium score was done on 11/3 and showed a score of 11 (88th percentile for age and sex matched control). The exercise tolerance test is scheduled for 11/17. Per [facility] records, he was prescribed Crestor 40 mg daily after his lipid check on 11/29/18. However, patient had said he hadn't been taking his Crestor at follow up lipid check on 09/13/19.   Patient presents today for initial visit with lipid clinic. He states that when he had taken Crestor it made his "body feel different and strange." He denied any localization of pain or uneasiness and said it was an all-over bad feeling. He thinks he may have tried atorvastatin in the past but isn't sure and doesn't remember if he had any side effects with it.  Current Medications: none  Intolerances: Crestor 40mg  daily  Risk Factors: HLD, elevated coronary calcium score, family history of CAD  LDL goal: <70  Diet: doesn't eat meat, salads, veggie spirals for spaghetti substitute, eggs. Drinks sparkling or regular water, he has cut out sodas for a while  Exercise: off and on with back issues, going to physical therapy now   Family History: Heart disease in his maternal grandmother; Hyperlipidemia in his mother; Hypertension in his father.  Social History: drinks alcohol occasionally, never smoker  Labs: 09/13/19: TC 285, TG 77, HDL 53, LDL 220 - none 11/29/18: TC 334, TG 189, HDL 41, LDL 256 - none  Past Medical History:  Diagnosis Date  . ADHD   . Aortic atherosclerosis ([place])   . Bilateral buttock  pain   . GERD (gastroesophageal reflux disease)   . Gynecomastia, male   . Herpes simplex type 2 infection   . Inattention   . Obesity   . Onychomycosis   . Pure hypercholesterolemia   . Right wrist pain 09/2013    Current Outpatient Medications on File Prior to Visit  Medication Sig Dispense Refill  . aspirin EC 81 MG tablet Take 81 mg by mouth daily.    . Fluticasone Furoate 50 MCG/ACT AEPB Inhale into the lungs.    . naproxen (NAPROSYN) 250 MG tablet Take by mouth 2 (two) times daily with a meal.     No current facility-administered medications on file prior to visit.     No Known Allergies  Assessment/Plan:  1. Hyperlipidemia - LDL is 220 (goal < 70) and TG is 77 (goal < 150). Coronary calcium score is 11 (88th percentile for sex and age). Start atorvastatin 40 mg daily. Informed patient to call if experiencing any uneasiness or side effects with this medication. Based on patient's baseline LDL, will likely need combination therapy with PCSK9-inhibitor in the future to achieve goal LDL. Patient educated about PCSK9 and counseled on how to use the pen if needed in the future. Follow up next lipid check in 2 months.    Vertis Kelch, PharmD PGY2 Cardiology Pharmacy  [facility] [address] [place], [place] [address] Phone: [phone]; Fax: [phone]

## 2019-09-18 ENCOUNTER — Other Ambulatory Visit: Payer: Self-pay

## 2019-09-18 ENCOUNTER — Ambulatory Visit (INDEPENDENT_AMBULATORY_CARE_PROVIDER_SITE_OTHER): Payer: BC Managed Care – PPO

## 2019-09-18 VITALS — BP 138/80 | HR 73

## 2019-09-18 DIAGNOSIS — I2510 Atherosclerotic heart disease of native coronary artery without angina pectoris: Secondary | ICD-10-CM

## 2019-09-18 DIAGNOSIS — E785 Hyperlipidemia, unspecified: Secondary | ICD-10-CM | POA: Insufficient documentation

## 2019-09-18 DIAGNOSIS — E782 Mixed hyperlipidemia: Secondary | ICD-10-CM

## 2019-09-18 MED ORDER — ATORVASTATIN CALCIUM 40 MG PO TABS: 40.0000 mg | ORAL_TABLET | Freq: Every day | ORAL | 4 refills | Status: DC

## 2019-09-18 NOTE — Patient Instructions (Addendum)
Thank you for seeing [place] today!  Your LDL is 220 (goal < 70).  Please start taking atorvastatin (Lipitor) 1 tablet (40 mg) once daily. We [facility] for [facility] to [facility] today.  We will have you come back on January 11th to recheck your cholesterol. You can come any time after 7:30 in the morning. Please make sure you have not eaten anything prior to coming to the lab appointment.   Please call us at [phone] if you have any questions or concerns.

## 2019-09-21 ENCOUNTER — Telehealth (HOSPITAL_COMMUNITY): Payer: Self-pay

## 2019-09-21 NOTE — Telephone Encounter (Signed)
Encounter complete. 

## 2019-09-22 ENCOUNTER — Other Ambulatory Visit (HOSPITAL_COMMUNITY)
Admission: RE | Admit: 2019-09-22 | Discharge: 2019-09-22 | Disposition: A | Discharge status: Home / Self Care | Payer: BC Managed Care – PPO | Source: Ambulatory Visit | Attending: Cardiology | Admitting: Cardiology

## 2019-09-22 DIAGNOSIS — Z01812 Encounter for preprocedural laboratory examination: Secondary | ICD-10-CM | POA: Insufficient documentation

## 2019-09-22 DIAGNOSIS — Z20828 Contact with and (suspected) exposure to other viral communicable diseases: Secondary | ICD-10-CM | POA: Insufficient documentation

## 2019-09-24 LAB — NOVEL CORONAVIRUS, NAA (HOSP ORDER, SEND-OUT TO REF LAB; TAT 18-24 HRS): SARS-CoV-2, NAA: NOT DETECTED

## 2019-09-25 ENCOUNTER — Telehealth: Payer: Self-pay

## 2019-09-25 NOTE — Telephone Encounter (Signed)
-----   Message from Sueanne Margarita, MD sent at 09/24/2019  2:52 PM EST ----- Please let patient know that labs were normal.  Continue current medical therapy.

## 2019-09-25 NOTE — Telephone Encounter (Signed)
Notes recorded by Frederik Schmidt, RN on 09/25/2019 at 8:31 AM EST  Lpm with results 11/16  ------

## 2019-09-26 ENCOUNTER — Other Ambulatory Visit: Payer: Self-pay

## 2019-09-26 ENCOUNTER — Ambulatory Visit (HOSPITAL_COMMUNITY)
Admission: RE | Admit: 2019-09-26 | Discharge: 2019-09-26 | Disposition: A | Discharge status: Home / Self Care | Payer: BC Managed Care – PPO | Source: Ambulatory Visit | Attending: Cardiovascular Disease | Admitting: Cardiovascular Disease

## 2019-09-26 DIAGNOSIS — I2510 Atherosclerotic heart disease of native coronary artery without angina pectoris: Secondary | ICD-10-CM | POA: Diagnosis not present

## 2019-09-26 LAB — EXERCISE TOLERANCE TEST
Estimated workload: 10.6 METS
Exercise duration (min): 9 min
Exercise duration (sec): 21 s
MPHR: 182 {beats}/min
Peak HR: 176 {beats}/min
Percent HR: 96 %
RPE: 17
Rest HR: 93 {beats}/min

## 2019-09-28 ENCOUNTER — Other Ambulatory Visit: Payer: Self-pay

## 2019-09-28 DIAGNOSIS — I10 Essential (primary) hypertension: Secondary | ICD-10-CM

## 2019-10-12 ENCOUNTER — Telehealth: Payer: Self-pay | Admitting: *Deleted

## 2019-10-12 NOTE — Telephone Encounter (Signed)
Patient scheduled for 24 hour ambulatory blood pressure monitor Tuesday 10/17/19 , 9:00AM.

## 2019-10-17 ENCOUNTER — Encounter (INDEPENDENT_AMBULATORY_CARE_PROVIDER_SITE_OTHER): Payer: BC Managed Care – PPO

## 2019-10-17 ENCOUNTER — Encounter (INDEPENDENT_AMBULATORY_CARE_PROVIDER_SITE_OTHER): Payer: Self-pay

## 2019-10-17 ENCOUNTER — Other Ambulatory Visit: Payer: Self-pay

## 2019-10-17 DIAGNOSIS — I10 Essential (primary) hypertension: Secondary | ICD-10-CM | POA: Diagnosis not present

## 2019-10-27 ENCOUNTER — Telehealth: Payer: Self-pay

## 2019-10-27 DIAGNOSIS — I10 Essential (primary) hypertension: Secondary | ICD-10-CM

## 2019-10-27 DIAGNOSIS — Z79899 Other long term (current) drug therapy: Secondary | ICD-10-CM

## 2019-10-27 MED ORDER — AMLODIPINE BESYLATE 2.5 MG PO TABS: 2.5000 mg | ORAL_TABLET | Freq: Every day | ORAL | 3 refills | Status: DC

## 2019-10-27 NOTE — Telephone Encounter (Signed)
-----   Message from Sueanne Margarita, MD sent at 10/25/2019 10:31 PM EST ----- BP mildly elevated - add amlodipine 2.5mg  daily and repeat 24 hour BP monitor

## 2019-10-27 NOTE — Telephone Encounter (Signed)
The patient has been notified of the result and verbalized understanding.  All questions (if any) were answered.  Antonieta Iba, RN 10/27/2019 2:47 PM

## 2019-11-20 ENCOUNTER — Other Ambulatory Visit: Payer: BC Managed Care – PPO

## 2019-11-21 ENCOUNTER — Telehealth: Payer: Self-pay | Admitting: Pharmacist

## 2019-11-21 NOTE — Telephone Encounter (Signed)
Left message for pt. He missed lab appt yesterday to check lipid panel/LFTs on atorvastatin 40mg  daily, this will need to be rescheduled.

## 2019-11-23 NOTE — Telephone Encounter (Signed)
Left 2nd message

## 2019-11-28 NOTE — Telephone Encounter (Signed)
Left 3rd message. Will await patient's return call.

## 2019-12-24 ENCOUNTER — Other Ambulatory Visit: Payer: Self-pay | Admitting: Cardiology

## 2020-04-09 ENCOUNTER — Other Ambulatory Visit: Payer: Self-pay | Admitting: General Surgery

## 2020-04-09 DIAGNOSIS — R1013 Epigastric pain: Secondary | ICD-10-CM

## 2020-04-18 ENCOUNTER — Other Ambulatory Visit: Payer: Self-pay

## 2020-04-18 ENCOUNTER — Ambulatory Visit
Admission: RE | Admit: 2020-04-18 | Discharge: 2020-04-18 | Disposition: A | Discharge status: Home / Self Care | Payer: BC Managed Care – PPO | Source: Ambulatory Visit | Attending: General Surgery | Admitting: General Surgery

## 2020-04-18 DIAGNOSIS — R1013 Epigastric pain: Secondary | ICD-10-CM

## 2020-04-18 MED ORDER — IOPAMIDOL (ISOVUE-300) INJECTION 61%
125.0000 mL | Freq: Once | INTRAVENOUS | Status: AC | PRN
  Administered 2020-04-18: 125 mL via INTRAVENOUS

## 2020-07-17 ENCOUNTER — Other Ambulatory Visit: Payer: BC Managed Care – PPO

## 2020-07-17 ENCOUNTER — Other Ambulatory Visit: Payer: Self-pay | Admitting: Critical Care Medicine

## 2020-07-17 DIAGNOSIS — Z20822 Contact with and (suspected) exposure to covid-19: Secondary | ICD-10-CM

## 2020-07-19 ENCOUNTER — Ambulatory Visit: Payer: Self-pay | Admitting: *Deleted

## 2020-07-19 NOTE — Telephone Encounter (Signed)
Patient requesting covid test results. Results pending at this time. Reviewed with patient he will be notified if results come back positive. Patient verbalized understanding .

## 2020-07-20 LAB — NOVEL CORONAVIRUS, NAA: SARS-CoV-2, NAA: NOT DETECTED

## 2020-07-23 ENCOUNTER — Other Ambulatory Visit: Payer: Self-pay

## 2020-07-23 ENCOUNTER — Other Ambulatory Visit: Payer: BC Managed Care – PPO

## 2020-07-23 DIAGNOSIS — Z20822 Contact with and (suspected) exposure to covid-19: Secondary | ICD-10-CM

## 2020-07-25 LAB — SARS-COV-2, NAA 2 DAY TAT

## 2020-07-25 LAB — NOVEL CORONAVIRUS, NAA: SARS-CoV-2, NAA: NOT DETECTED

## 2020-07-26 ENCOUNTER — Other Ambulatory Visit: Payer: BC Managed Care – PPO

## 2020-07-29 ENCOUNTER — Other Ambulatory Visit: Payer: Self-pay

## 2020-07-29 ENCOUNTER — Other Ambulatory Visit: Payer: BC Managed Care – PPO

## 2020-07-29 DIAGNOSIS — Z20822 Contact with and (suspected) exposure to covid-19: Secondary | ICD-10-CM

## 2020-07-31 LAB — NOVEL CORONAVIRUS, NAA: SARS-CoV-2, NAA: NOT DETECTED

## 2020-07-31 LAB — SARS-COV-2, NAA 2 DAY TAT

## 2020-09-30 ENCOUNTER — Other Ambulatory Visit: Payer: BC Managed Care – PPO

## 2020-09-30 DIAGNOSIS — Z20822 Contact with and (suspected) exposure to covid-19: Secondary | ICD-10-CM

## 2020-10-01 LAB — SARS-COV-2, NAA 2 DAY TAT

## 2020-10-01 LAB — NOVEL CORONAVIRUS, NAA: SARS-CoV-2, NAA: NOT DETECTED

## 2020-10-02 ENCOUNTER — Other Ambulatory Visit: Payer: Self-pay | Admitting: Cardiology

## 2020-10-03 ENCOUNTER — Other Ambulatory Visit: Payer: Self-pay | Admitting: Cardiology

## 2020-11-02 ENCOUNTER — Other Ambulatory Visit: Payer: Self-pay | Admitting: Cardiology

## 2020-11-05 ENCOUNTER — Other Ambulatory Visit: Payer: BC Managed Care – PPO

## 2020-11-05 DIAGNOSIS — Z20822 Contact with and (suspected) exposure to covid-19: Secondary | ICD-10-CM

## 2020-11-06 LAB — NOVEL CORONAVIRUS, NAA: SARS-CoV-2, NAA: NOT DETECTED

## 2020-11-06 LAB — SARS-COV-2, NAA 2 DAY TAT

## 2020-11-15 ENCOUNTER — Other Ambulatory Visit: Payer: BC Managed Care – PPO

## 2020-11-15 DIAGNOSIS — Z20822 Contact with and (suspected) exposure to covid-19: Secondary | ICD-10-CM

## 2020-11-17 LAB — SARS-COV-2, NAA 2 DAY TAT

## 2020-11-17 LAB — NOVEL CORONAVIRUS, NAA: SARS-CoV-2, NAA: NOT DETECTED

## 2020-11-19 ENCOUNTER — Other Ambulatory Visit: Payer: Self-pay | Admitting: Cardiology

## 2020-11-28 ENCOUNTER — Other Ambulatory Visit: Payer: BC Managed Care – PPO

## 2020-12-06 ENCOUNTER — Other Ambulatory Visit: Payer: BC Managed Care – PPO

## 2020-12-06 DIAGNOSIS — Z20822 Contact with and (suspected) exposure to covid-19: Secondary | ICD-10-CM

## 2020-12-07 LAB — NOVEL CORONAVIRUS, NAA: SARS-CoV-2, NAA: NOT DETECTED

## 2020-12-07 LAB — SARS-COV-2, NAA 2 DAY TAT

## 2020-12-11 ENCOUNTER — Other Ambulatory Visit: Payer: BC Managed Care – PPO

## 2020-12-11 DIAGNOSIS — Z20822 Contact with and (suspected) exposure to covid-19: Secondary | ICD-10-CM

## 2020-12-12 LAB — SARS-COV-2, NAA 2 DAY TAT

## 2020-12-12 LAB — NOVEL CORONAVIRUS, NAA: SARS-CoV-2, NAA: NOT DETECTED

## 2020-12-17 ENCOUNTER — Other Ambulatory Visit: Payer: Self-pay

## 2020-12-17 ENCOUNTER — Other Ambulatory Visit: Payer: BC Managed Care – PPO

## 2020-12-17 DIAGNOSIS — Z20822 Contact with and (suspected) exposure to covid-19: Secondary | ICD-10-CM

## 2020-12-18 LAB — NOVEL CORONAVIRUS, NAA: SARS-CoV-2, NAA: NOT DETECTED

## 2020-12-18 LAB — SARS-COV-2, NAA 2 DAY TAT

## 2020-12-19 IMAGING — CT CT ABD-PELV W/ CM
1 of 2 series · 14 of 32 positions shown, 19 images · IV contrast (APPLIED)
Comparison: None.

CLINICAL DATA: Abdominal and pelvic pain and epigastric region and
right groin for 3 months.

EXAM:
CT ABDOMEN AND PELVIS WITH CONTRAST
TECHNIQUE: Multidetector CT imaging of the abdomen and pelvis was performed
using the standard protocol following bolus administration of
intravenous contrast.
CONTRAST:  125mL QKX55Z-NDD IOPAMIDOL (QKX55Z-NDD) INJECTION 61%

[Series 2: abd/pelvis w/cm · axial · 0.98mm/px · z∈[-529,-24]mm · 14 of 115 slices shown, 19 images]
[im 7/115  soft-tissue]
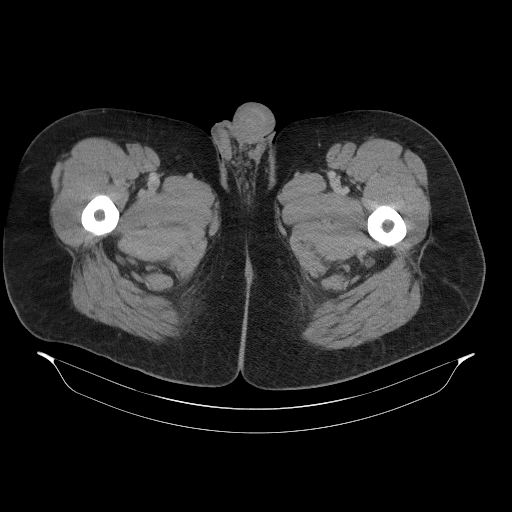
[im 7/115  bone]
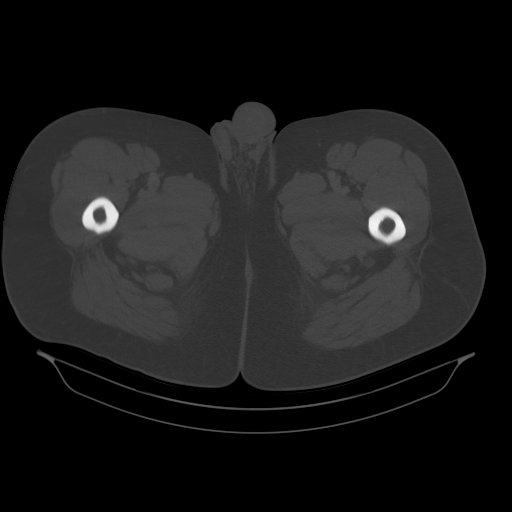
[im 13/115  soft-tissue]
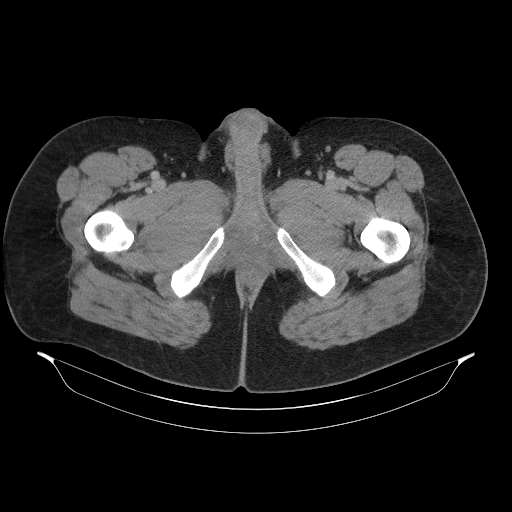
[im 26/115  soft-tissue]
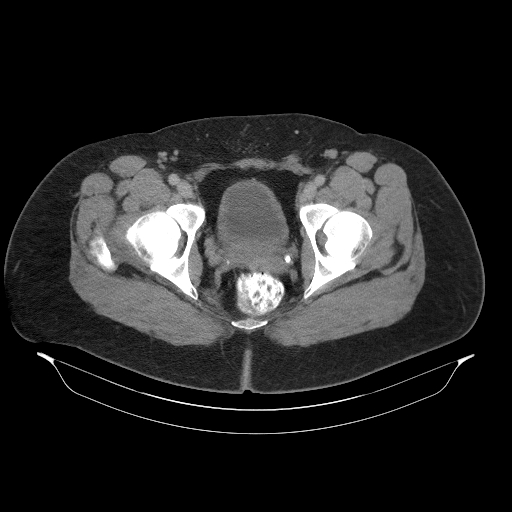
[im 32/115  soft-tissue]
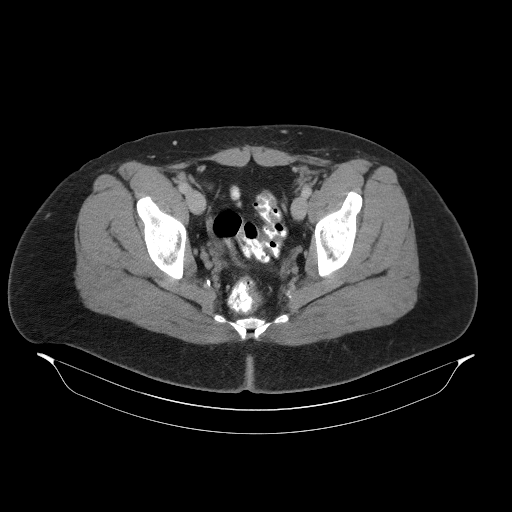
[im 39/115  soft-tissue]
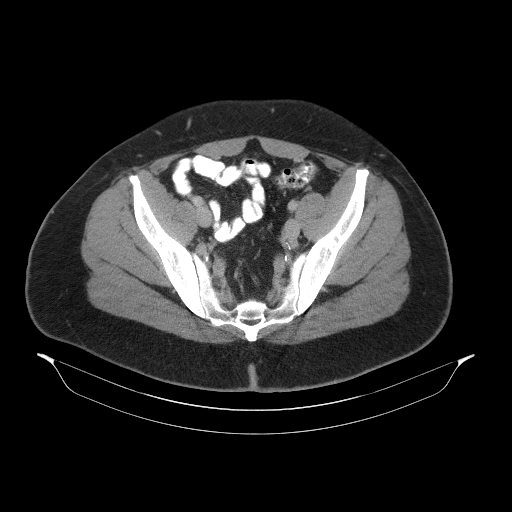
[im 51/115  soft-tissue]
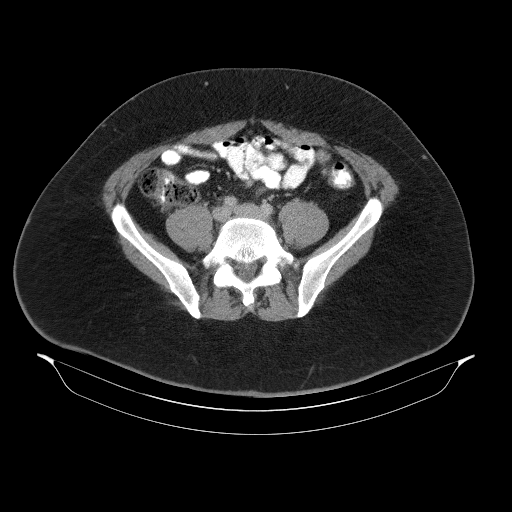
[im 58/115  soft-tissue]
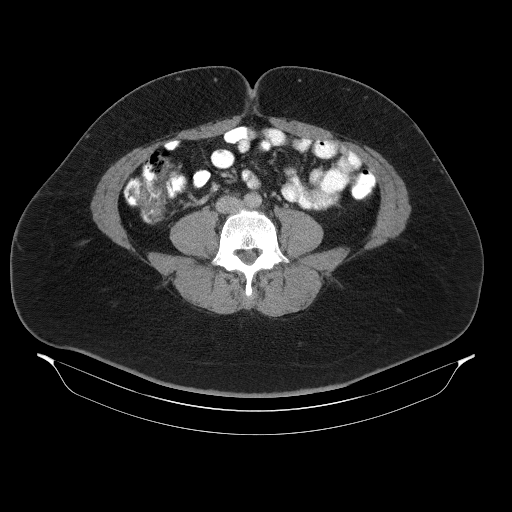
[im 64/115  soft-tissue]
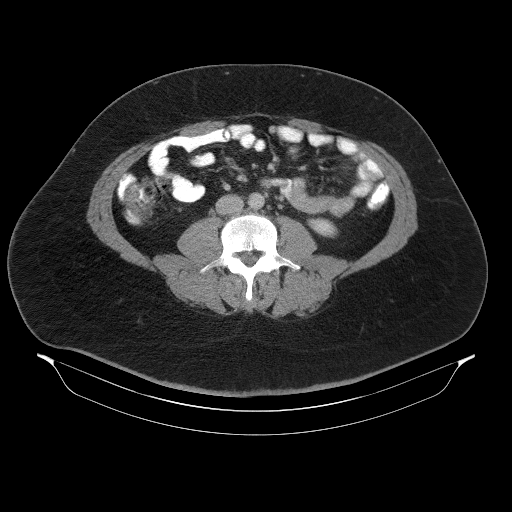
[im 77/115  soft-tissue]
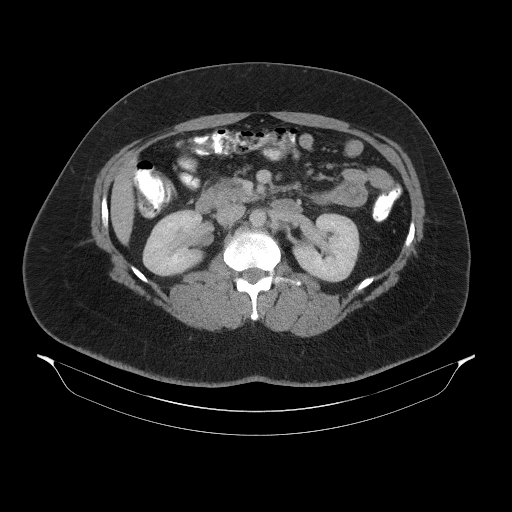
[im 77/115  bone]
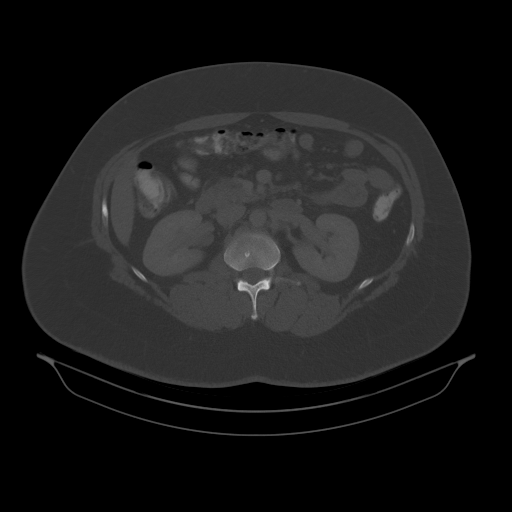
[im 83/115  soft-tissue]
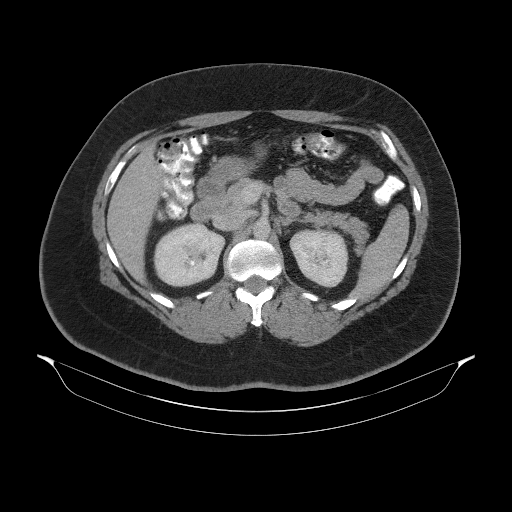
[im 89/115  soft-tissue]
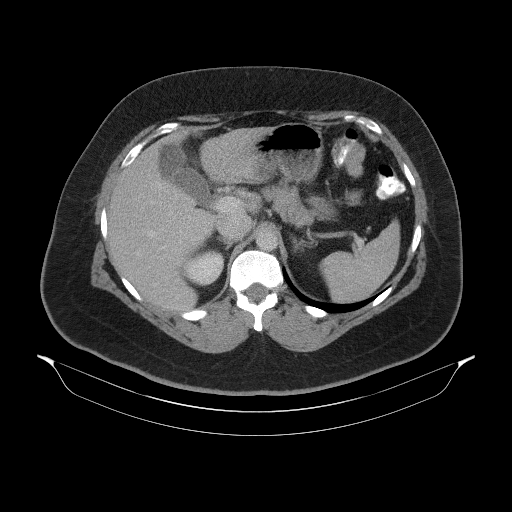
[im 89/115  lung]
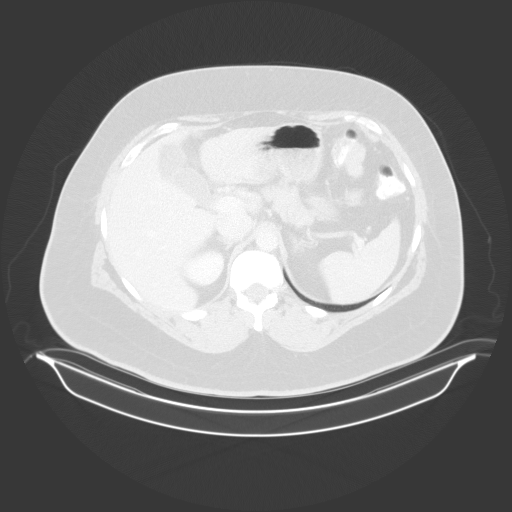
[im 96/115  lung]
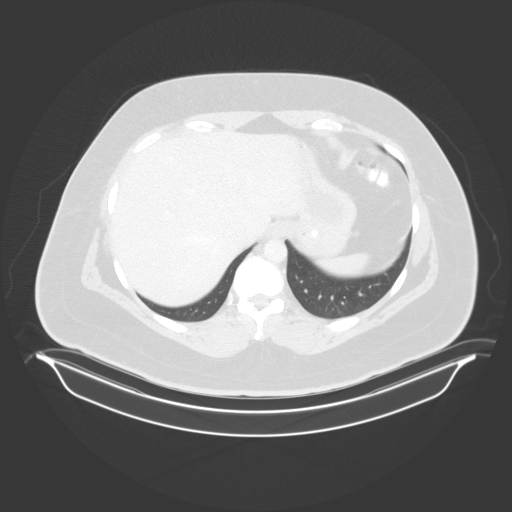
[im 102/115  soft-tissue]
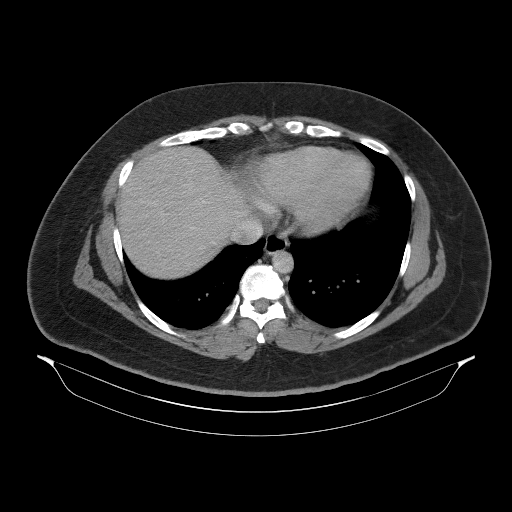
[im 102/115  lung]
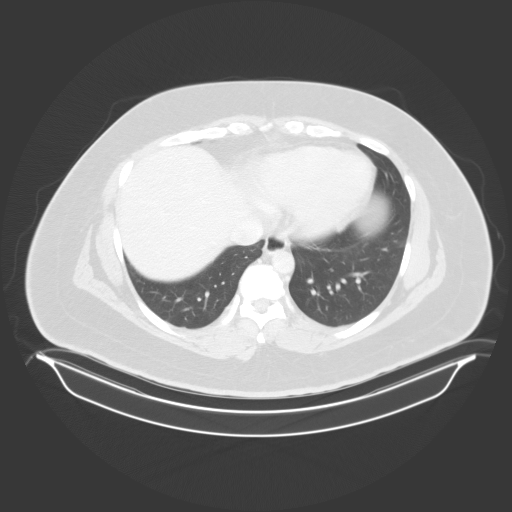
[im 108/115  soft-tissue]
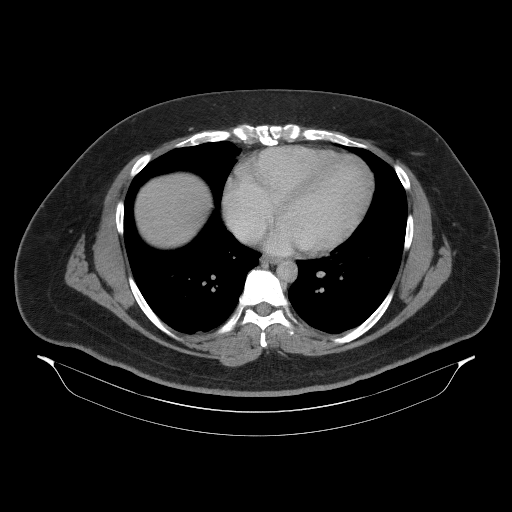
[im 108/115  lung]
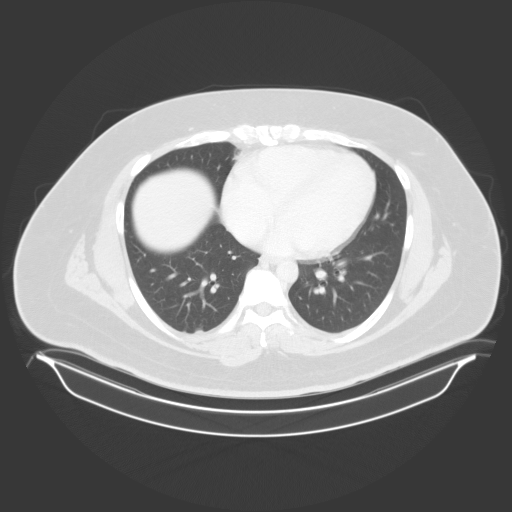

[14 of 32 positions shown; findings below may reference images not displayed]

FINDINGS: Lower Chest: No acute findings.

Hepatobiliary: No hepatic masses identified. Gallbladder is
unremarkable. No evidence of biliary ductal dilatation.

Pancreas:  No mass or inflammatory changes.

Spleen: Within normal limits in size and appearance.

Adrenals/Urinary Tract: No masses identified. No evidence of
ureteral calculi or hydronephrosis.

Stomach/Bowel: No evidence of obstruction, inflammatory process or
abnormal fluid collections. Normal appendix visualized.

Vascular/Lymphatic: No pathologically enlarged lymph nodes. No
abdominal aortic aneurysm.

Reproductive:  No mass or other significant abnormality.

Other:  None.

Musculoskeletal:  No suspicious bone lesions identified.
IMPRESSION: Negative. No acute findings or other significant abnormality.

## 2020-12-23 ENCOUNTER — Other Ambulatory Visit: Payer: Self-pay | Admitting: Cardiology

## 2020-12-25 ENCOUNTER — Other Ambulatory Visit: Payer: BC Managed Care – PPO

## 2020-12-25 DIAGNOSIS — Z20822 Contact with and (suspected) exposure to covid-19: Secondary | ICD-10-CM

## 2020-12-26 LAB — NOVEL CORONAVIRUS, NAA: SARS-CoV-2, NAA: NOT DETECTED

## 2020-12-26 LAB — SARS-COV-2, NAA 2 DAY TAT

## 2021-01-07 ENCOUNTER — Other Ambulatory Visit: Payer: Self-pay | Admitting: Cardiology

## 2021-10-23 ENCOUNTER — Encounter (HOSPITAL_BASED_OUTPATIENT_CLINIC_OR_DEPARTMENT_OTHER): Payer: Self-pay | Admitting: Internal Medicine

## 2021-10-23 ENCOUNTER — Other Ambulatory Visit: Payer: Self-pay

## 2021-10-23 ENCOUNTER — Ambulatory Visit (INDEPENDENT_AMBULATORY_CARE_PROVIDER_SITE_OTHER): Payer: BC Managed Care – PPO | Admitting: Internal Medicine

## 2021-10-23 ENCOUNTER — Telehealth: Payer: Self-pay | Admitting: Internal Medicine

## 2021-10-23 VITALS — BP 128/82 | HR 83 | Ht 75.0 in | Wt 306.2 lb

## 2021-10-23 DIAGNOSIS — E7801 Familial hypercholesterolemia: Secondary | ICD-10-CM

## 2021-10-23 DIAGNOSIS — E668 Other obesity: Secondary | ICD-10-CM

## 2021-10-23 DIAGNOSIS — I2510 Atherosclerotic heart disease of native coronary artery without angina pectoris: Secondary | ICD-10-CM | POA: Diagnosis not present

## 2021-10-23 DIAGNOSIS — I10 Essential (primary) hypertension: Secondary | ICD-10-CM | POA: Diagnosis not present

## 2021-10-23 DIAGNOSIS — I2584 Coronary atherosclerosis due to calcified coronary lesion: Secondary | ICD-10-CM

## 2021-10-23 MED ORDER — PRALUENT 150 MG/ML ~~LOC~~ SOAJ: 1.0000 | SUBCUTANEOUS | 3 refills | Status: DC

## 2021-10-23 MED ORDER — REPATHA SURECLICK 140 MG/ML ~~LOC~~ SOAJ: 1.0000 | SUBCUTANEOUS | 3 refills | Status: DC

## 2021-10-23 NOTE — Telephone Encounter (Signed)
Genetic test for ASCVD/dyslipidemia ordered ([facility]) Cheek swab completed in office Specimen and necessary paperwork mailed. ID: [id number]

## 2021-10-23 NOTE — Patient Instructions (Signed)
Medication Instructions:  Dr. Rennis Golden recommends Repatha 140mg /mL (PCSK9). This is an injectable cholesterol medication self-administered once every 14 days. This medication will likely need prior approval with your insurance company, which we will work on. If the medication is not approved initially, we may need to do an appeal with your insurance.   Administer medication in area of fatty tissue such as abdomen, outer thigh, back of upper arm - and rotate site with each injection Store medication in refrigerator until ready to administer - allow to sit at room temp for 30 mins - 1 hour prior to injection Dispose of medication in a SHARPS container - your pharmacy should be able to direct you on this and proper disposal   If you need a co-pay card for Repatha: >> paying for Repatha or red box that says "Repatha Copay Card" in top right If you need a co-pay card for Praluent: [email or web address] >> starting & paying for Praluent  Patient Assistance:   The PAN Foundation: https://www.panfoundation.org/disease-funds/hypercholesterolemia/ -- can sign up for wait list  The [employer] Well foundation offers assistance to help pay for medication copays.  They will cover copays for all cholesterol lowering meds, including statins, fibrates, omega-3 fish oils like Vascepa, ezetimibe, Repatha, Praluent, Nexletol, Nexlizet.  The cards are usually good for $2,500 or 12 months, whichever comes first. Go to healthwellfoundation.org Click on Apply Now Answer questions as to whom is applying (patient or representative) Your disease fund will be hypercholesterolemia - Medicare access They will ask questions about finances and which medications you are taking for cholesterol When you submit, the approval is usually within minutes.  You will need to print the card information from the site You will need to show this information to your pharmacy, they will bill your Medicare Part D plan first -then bill Health  Well --for the copay.   You can also call them at [phone], although the hold times can be quite long.     *If you need a refill on your cardiac medications before your next appointment, please call your pharmacy*   Lab Work: FASTING lab work to check cholesterol in 3-4 months -- complete about 1 week before your next appointment with Dr. [phone]   If you have labs (blood work) drawn today and your tests are completely normal, you will receive your results only by: MyChart Message (if you have MyChart) OR A paper copy in the mail If you have any lab test that is abnormal or we need to change your treatment, we will call you to review the results.   Testing/Procedures: Genetic Test - dyslipidemia, ASCVD   Follow-Up: At [facility], you and your health needs are our priority.  As part of our continuing mission to provide you with exceptional heart care, we have created designated Provider Care Teams.  These Care Teams include your primary Cardiologist (physician) and Advanced Practice Providers (APPs -  Physician Assistants and Nurse Practitioners) who all work together to provide you with the care you need, when you need it.  We recommend signing up for the patient portal called "MyChart".  Sign up information is provided on this After Visit Summary.  MyChart is used to connect with patients for Virtual Visits (Telemedicine).  Patients are able to view lab/test results, encounter notes, upcoming appointments, etc.  Non-urgent messages can be sent to your provider as well.   To learn more about what you can do with MyChart, go to [facility].    Your next appointment:  4 month(s)  The format for your next appointment:   In Person  Provider:   Dr. Zoila Shutter -- lipid clinic

## 2021-10-23 NOTE — Telephone Encounter (Signed)
-----   Message from Chrystie Nose, MD sent at 10/23/2021  4:50 PM EST ----- Regarding: RE: PCSK9 Praluent 150 mg q2w - thanks  ----- Message ----- From: Lindell Spar, RN Sent: 10/23/2021   3:57 PM EST To: Chrystie Nose, MD Subject: PCSK9                                          Attempted PA for Repatha -- preferred is Praluent. Please advise of dose and OK for change.   Thanks

## 2021-10-23 NOTE — Progress Notes (Signed)
LIPID CLINIC CONSULT NOTE  Chief Complaint:  Manage dyslipidemia  Primary Care Physician: Randy Elk, MD  Primary Cardiologist:  None  HPI:  Randy Craig is a 40 y.o. male who is being seen today for the evaluation of dyslipidemia at the request of Randy Rua, MD. this is a pleasant 40 year old male kindly referred for evaluation and management of dyslipidemia.  He has previously been seen by cardiology, Randy Craig in 2020 and was noted to have coronary artery calcification that was scattered on his CT scan and also it was noted that he had a very high untreated LDL cholesterol.  He was prescribed to be on atorvastatin but has had some noncompliance.  Recent labs in November showed total cholesterol 333, HDL 49, triglycerides 162 and LDL 253.  This was not on treatment.  He reported then starting his atorvastatin more regularly and ezetimibe was added.  He then had an insurance physical in December which was about 1 month after starting the ezetimibe and restarting his atorvastatin, his total cholesterol is down to 166, HDL 61, triglycerides 71 and LDL of 90.  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  We discussed this at further length today.  He notes that his maternal grandmother had a three-vessel CABG in her 26s and his mother has high LDL cholesterol.  He reports a varied diet but has been trying to reduce saturated fats and meats.  He was exercising more regularly but recently has been more sedentary.  PMHx:  Past Medical History:  Diagnosis Date   ADHD    Aortic atherosclerosis (HCC)    Bilateral buttock pain    GERD (gastroesophageal reflux disease)    Gynecomastia, male    Herpes simplex type 2 infection    Inattention    Obesity    Onychomycosis    Pure hypercholesterolemia    Right wrist pain 09/2013    Past Surgical History:  Procedure Laterality Date   NO PAST SURGERIES     WRIST ARTHROSCOPY  WITH FOVEAL TRIANGULAR FIBROCARTILAGE COMPLEX REPAIR Left 10/09/2013   Procedure: RIGHT WRIST ARTHROSCOPY TFCC REPAIR    ;  Surgeon: Randy Marble, MD;  Location: [facility];  Service: Orthopedics;  Laterality: Left;    FAMHx:  Family History  Problem Relation Age of Onset   Hyperlipidemia Mother    Hypertension Father    Heart disease Maternal Grandmother        CABG    SOCHx:   reports that he has never smoked. He has never used smokeless tobacco. He reports current alcohol use. He reports that he does not use drugs.  ALLERGIES:  No Known Allergies  ROS: Pertinent items noted in HPI and remainder of comprehensive ROS otherwise negative.  HOME MEDS: Current Outpatient Medications on File Prior to Visit  Medication Sig Dispense Refill   amLODipine (NORVASC) 2.5 MG tablet Take 1 tablet (2.5 mg total) by mouth daily. Please make overdue appt with Randy Craig before anymore refills. Thank you 2nd attempt 15 tablet 0   atorvastatin (LIPITOR) 40 MG tablet Take 1 tablet (40 mg total) by mouth daily. Please make overdue appt with Randy Craig before anymore refills. Thank you 2nd attempt 15 tablet 0   ezetimibe (ZETIA) 10 MG tablet 1 tablet     No current facility-administered medications on file prior to visit.    LABS/IMAGING: No results found for this or any previous visit (from the  past 48 hour(s)). No results found.  LIPID PANEL:    Component Value Date/Time   CHOL 285 (H) 09/13/2019 0742   TRIG 77 09/13/2019 0742   HDL 53 09/13/2019 0742   CHOLHDL 5.4 (H) 09/13/2019 0742   LDLCALC 220 (H) 09/13/2019 0742    WEIGHTS: Wt Readings from Last 3 Encounters:  10/23/21 (!) 306 lb 3.2 oz (138.9 kg)  01/16/19 (!) 302 lb 9.6 oz (137.3 kg)  06/29/14 268 lb 12.8 oz (121.9 kg)    VITALS: BP 128/82    Pulse 83    Ht 6\' 3"  (1.905 m)    Wt (!) 306 lb 3.2 oz (138.9 kg)    SpO2 96%    BMI 38.27 kg/m   EXAM: General appearance: alert and no distress Neck: no  carotid bruit, no JVD, and thyroid not enlarged, symmetric, no tenderness/mass/nodules Lungs: clear to auscultation bilaterally Heart: regular rate and rhythm, S1, S2 normal, no murmur, click, rub or gallop Abdomen: soft, non-tender; bowel sounds normal; no masses,  no organomegaly Extremities: extremities normal, atraumatic, no cyanosis or edema Pulses: 2+ and symmetric Skin: Skin color, texture, turgor normal. No rashes or lesions Neurologic: Grossly normal Psych:  EKG: Deferred  ASSESSMENT: Probable familial hyperlipidemia, LDL untreated greater than 190 Family history of high LDL cholesterol and early onset heart disease in mother and grandmother Multivessel coronary artery calcification Moderate obesity Hypertension  PLAN: 1.   Randy Craig has a probable familial hyperlipidemia with an untreated LDL cholesterol of around Imogene Burn.  I am recommending genetic testing today as he has 3 children and a strong family history of genetic dyslipidemia, this is suspicious for familial hyperlipidemia.  It turns out he was noncompliant with the atorvastatin and likely his LDL was the untreated number of 253 but since restarting that and the addition of ezetimibe, his recent insurance physical showed total cholesterol 166 and LDL down to 90.  This still remains above target and I think he would benefit from a PCSK9 inhibitor.  Ultimately we may be able to discontinue his ezetimibe.  We will reach out to [facility] for prior authorization.  Follow-up with genetic testing and repeat lipids in about 3 months.  Thanks again for the kind referral.  Randy [place], MD, [facility]  Randy Craig   [facility]  Medical Director of the Advanced Lipid Disorders &  Cardiovascular Risk Reduction Clinic Diplomate of the American Board of Clinical Lipidology Attending Cardiologist  Direct Dial: [phone]   Fax: [phone]  Website:  [email or web address] Randy Craig 10/23/2021, 9:11 AM

## 2021-10-24 NOTE — Telephone Encounter (Signed)
[facility] called for prior auth for Repatha.  REF # [id number] batch [phone]

## 2021-10-24 NOTE — Telephone Encounter (Signed)
Plan prefers Praluet.  Randy Craig already sent in

## 2021-10-27 NOTE — Telephone Encounter (Signed)
Called pharmacy to check on Praleunt, is PA needed  ID: [id number] BIN: [id number] PCN: [id number] RxGrp: [id number]  PA submitted via CMM (Key: [id number])  Medication approved

## 2021-10-28 NOTE — Telephone Encounter (Signed)
Left message to call back regarding update on PCSK9. MyChart message sent

## 2021-11-05 ENCOUNTER — Telehealth: Payer: Self-pay

## 2021-11-05 NOTE — Telephone Encounter (Signed)
PA ID: [id number] request has been approved. Key [id number] until 10/27/2022.

## 2021-11-12 DIAGNOSIS — E785 Hyperlipidemia, unspecified: Secondary | ICD-10-CM | POA: Diagnosis not present

## 2021-11-19 ENCOUNTER — Encounter (HOSPITAL_BASED_OUTPATIENT_CLINIC_OR_DEPARTMENT_OTHER): Payer: Self-pay | Admitting: Internal Medicine

## 2021-11-25 ENCOUNTER — Encounter (HOSPITAL_BASED_OUTPATIENT_CLINIC_OR_DEPARTMENT_OTHER): Payer: Self-pay

## 2021-12-05 ENCOUNTER — Other Ambulatory Visit: Payer: Self-pay | Admitting: Cardiology

## 2022-03-02 DIAGNOSIS — G5702 Lesion of sciatic nerve, left lower limb: Secondary | ICD-10-CM | POA: Diagnosis not present

## 2022-03-03 ENCOUNTER — Other Ambulatory Visit (HOSPITAL_BASED_OUTPATIENT_CLINIC_OR_DEPARTMENT_OTHER): Payer: Self-pay

## 2022-03-18 ENCOUNTER — Ambulatory Visit (HOSPITAL_BASED_OUTPATIENT_CLINIC_OR_DEPARTMENT_OTHER): Payer: BC Managed Care – PPO | Admitting: Internal Medicine

## 2022-03-26 DIAGNOSIS — H938X1 Other specified disorders of right ear: Secondary | ICD-10-CM | POA: Diagnosis not present

## 2022-04-08 DIAGNOSIS — K921 Melena: Secondary | ICD-10-CM | POA: Diagnosis not present

## 2022-04-08 DIAGNOSIS — R109 Unspecified abdominal pain: Secondary | ICD-10-CM | POA: Diagnosis not present

## 2022-04-08 DIAGNOSIS — R030 Elevated blood-pressure reading, without diagnosis of hypertension: Secondary | ICD-10-CM | POA: Diagnosis not present

## 2022-04-28 DIAGNOSIS — J40 Bronchitis, not specified as acute or chronic: Secondary | ICD-10-CM | POA: Diagnosis not present

## 2022-05-11 ENCOUNTER — Telehealth: Payer: Self-pay | Admitting: Internal Medicine

## 2022-05-11 MED ORDER — REPATHA SURECLICK 140 MG/ML ~~LOC~~ SOAJ: 1.0000 | SUBCUTANEOUS | 1 refills | Status: DC

## 2022-05-11 NOTE — Telephone Encounter (Signed)
Spoke with [facility] [employer] rep and was informed that Praluent approval dates transferred over to Repatha since plan changed preferred product as of May 09, 2022  Auth date: ends 10/27/2022  Rx sent [facility] Patient updated via MyChart message Sent co-pay card info

## 2022-06-01 ENCOUNTER — Other Ambulatory Visit: Payer: Self-pay | Admitting: Internal Medicine

## 2022-06-18 DIAGNOSIS — T148XXA Other injury of unspecified body region, initial encounter: Secondary | ICD-10-CM | POA: Diagnosis not present

## 2022-06-18 DIAGNOSIS — M79662 Pain in left lower leg: Secondary | ICD-10-CM | POA: Diagnosis not present

## 2022-08-31 ENCOUNTER — Encounter (HOSPITAL_COMMUNITY): Payer: Self-pay | Admitting: *Deleted

## 2022-08-31 ENCOUNTER — Emergency Department (HOSPITAL_COMMUNITY): Payer: BC Managed Care – PPO

## 2022-08-31 ENCOUNTER — Other Ambulatory Visit: Payer: Self-pay

## 2022-08-31 ENCOUNTER — Emergency Department (HOSPITAL_COMMUNITY)
Admission: EM | Admit: 2022-08-31 | Discharge: 2022-08-31 | Disposition: A | Discharge status: Home / Self Care | Payer: BC Managed Care – PPO | Attending: Emergency Medicine | Admitting: Emergency Medicine

## 2022-08-31 DIAGNOSIS — K5730 Diverticulosis of large intestine without perforation or abscess without bleeding: Secondary | ICD-10-CM | POA: Diagnosis not present

## 2022-08-31 DIAGNOSIS — N400 Enlarged prostate without lower urinary tract symptoms: Secondary | ICD-10-CM

## 2022-08-31 DIAGNOSIS — N200 Calculus of kidney: Secondary | ICD-10-CM | POA: Diagnosis not present

## 2022-08-31 DIAGNOSIS — N132 Hydronephrosis with renal and ureteral calculous obstruction: Secondary | ICD-10-CM | POA: Diagnosis not present

## 2022-08-31 DIAGNOSIS — N139 Obstructive and reflux uropathy, unspecified: Secondary | ICD-10-CM | POA: Diagnosis not present

## 2022-08-31 DIAGNOSIS — N401 Enlarged prostate with lower urinary tract symptoms: Secondary | ICD-10-CM | POA: Diagnosis not present

## 2022-08-31 DIAGNOSIS — I10 Essential (primary) hypertension: Secondary | ICD-10-CM | POA: Diagnosis not present

## 2022-08-31 DIAGNOSIS — N202 Calculus of kidney with calculus of ureter: Secondary | ICD-10-CM | POA: Diagnosis not present

## 2022-08-31 DIAGNOSIS — R319 Hematuria, unspecified: Secondary | ICD-10-CM | POA: Diagnosis not present

## 2022-08-31 DIAGNOSIS — R7989 Other specified abnormal findings of blood chemistry: Secondary | ICD-10-CM | POA: Diagnosis not present

## 2022-08-31 DIAGNOSIS — Z79899 Other long term (current) drug therapy: Secondary | ICD-10-CM | POA: Diagnosis not present

## 2022-08-31 DIAGNOSIS — N1330 Unspecified hydronephrosis: Secondary | ICD-10-CM | POA: Diagnosis not present

## 2022-08-31 LAB — CBC WITH DIFFERENTIAL/PLATELET
Abs Immature Granulocytes: 0.04 10*3/uL (ref 0.00–0.07)
Basophils Absolute: 0 10*3/uL (ref 0.0–0.1)
Basophils Relative: 0 %
Eosinophils Absolute: 0 10*3/uL (ref 0.0–0.5)
Eosinophils Relative: 0 %
HCT: 44.7 % (ref 39.0–52.0)
Hemoglobin: 14.7 g/dL (ref 13.0–17.0)
Immature Granulocytes: 0 %
Lymphocytes Relative: 9 %
Lymphs Abs: 1 10*3/uL (ref 0.7–4.0)
MCH: 26.8 pg (ref 26.0–34.0)
MCHC: 32.9 g/dL (ref 30.0–36.0)
MCV: 81.4 fL (ref 80.0–100.0)
Monocytes Absolute: 0.4 10*3/uL (ref 0.1–1.0)
Monocytes Relative: 3 %
Neutro Abs: 10.6 10*3/uL — ABNORMAL HIGH (ref 1.7–7.7)
Neutrophils Relative %: 88 %
Platelets: 239 10*3/uL (ref 150–400)
RBC: 5.49 MIL/uL (ref 4.22–5.81)
RDW: 12.9 % (ref 11.5–15.5)
WBC: 12.1 10*3/uL — ABNORMAL HIGH (ref 4.0–10.5)
nRBC: 0 % (ref 0.0–0.2)

## 2022-08-31 LAB — URINALYSIS, ROUTINE W REFLEX MICROSCOPIC
Bilirubin Urine: NEGATIVE
Glucose, UA: NEGATIVE mg/dL
Ketones, ur: NEGATIVE mg/dL
Leukocytes,Ua: NEGATIVE
Nitrite: NEGATIVE
Protein, ur: NEGATIVE mg/dL
Specific Gravity, Urine: 1.02 (ref 1.005–1.030)
pH: 6 (ref 5.0–8.0)

## 2022-08-31 LAB — URINALYSIS, MICROSCOPIC (REFLEX): RBC / HPF: >50 RBC/hpf (ref 0–5)

## 2022-08-31 LAB — COMPREHENSIVE METABOLIC PANEL
ALT: 24 U/L (ref 0–44)
AST: 20 U/L (ref 15–41)
Albumin: 4 g/dL (ref 3.5–5.0)
Alkaline Phosphatase: 61 U/L (ref 38–126)
Anion gap: 12 (ref 5–15)
BUN: 11 mg/dL (ref 6–20)
CO2: 23 mmol/L (ref 22–32)
Calcium: 9.1 mg/dL (ref 8.9–10.3)
Chloride: 102 mmol/L (ref 98–111)
Creatinine, Ser: 0.9 mg/dL (ref 0.61–1.24)
GFR, Estimated: >60 mL/min (ref >60)
Glucose, Bld: 130 mg/dL — ABNORMAL HIGH (ref 70–99)
Potassium: 3.9 mmol/L (ref 3.5–5.1)
Sodium: 137 mmol/L (ref 135–145)
Total Bilirubin: 0.9 mg/dL (ref 0.3–1.2)
Total Protein: 7.1 g/dL (ref 6.5–8.1)

## 2022-08-31 LAB — LIPASE, BLOOD: Lipase: 31 U/L (ref 11–51)

## 2022-08-31 MED ORDER — LACTATED RINGERS IV BOLUS
1000.0000 mL | Freq: Once | INTRAVENOUS | Status: AC
  Administered 2022-08-31: 1000 mL via INTRAVENOUS

## 2022-08-31 MED ORDER — OXYCODONE HCL 5 MG PO TABS: 5.0000 mg | ORAL_TABLET | ORAL | 0 refills | Status: DC | PRN

## 2022-08-31 MED ORDER — OXYCODONE-ACETAMINOPHEN 5-325 MG PO TABS
1.0000 | ORAL_TABLET | Freq: Once | ORAL | Status: AC
  Administered 2022-08-31: 1 via ORAL
  Filled 2022-08-31: qty 1

## 2022-08-31 MED ORDER — TAMSULOSIN HCL 0.4 MG PO CAPS: 0.4000 mg | ORAL_CAPSULE | Freq: Every day | ORAL | 0 refills | Status: DC

## 2022-08-31 MED ORDER — KETOROLAC TROMETHAMINE 15 MG/ML IJ SOLN
15.0000 mg | Freq: Once | INTRAMUSCULAR | Status: AC
  Administered 2022-08-31: 15 mg via INTRAVENOUS
  Filled 2022-08-31: qty 1

## 2022-08-31 MED ORDER — ONDANSETRON HCL 4 MG PO TABS: 4.0000 mg | ORAL_TABLET | Freq: Four times a day (QID) | ORAL | 0 refills | Status: AC

## 2022-08-31 MED ORDER — ONDANSETRON 4 MG PO TBDP
4.0000 mg | ORAL_TABLET | Freq: Once | ORAL | Status: AC
  Administered 2022-08-31: 4 mg via ORAL
  Filled 2022-08-31: qty 1

## 2022-08-31 NOTE — ED Notes (Signed)
Left sided flank pain and vomiting. Denies fevers.   Patient aware that we need urine sample for testing, unable at this time. Pt given instruction on providing urine sample when able to do so.  Patient educated about not driving or performing other critical tasks (such as operating heavy machinery, caring for infant/toddler/child) due to sedative nature of narcotic medications received while in the ED.  Pt/caregiver verbalized understanding.

## 2022-08-31 NOTE — ED Provider Triage Note (Signed)
Emergency Medicine Provider Triage Evaluation Note  Randy Craig , a 41 y.o. male  was evaluated in triage.  Pt complains of left flank pain. Started to have hematuria with mild pain yesterday then tonight around 23:30 pain worsened, radiates into the left abdomen with nausea & vomiting. Denies testicular pain/swelling, dysuria or fever.  Review of Systems  Per above  Physical Exam  BP (!) 148/86   Pulse 85   Temp 98.2 F (36.8 C) (Oral)   Resp 18   SpO2 98%  Gen:   Awake, no distress   Resp:  Normal effort  MSK:   Moves extremities without difficulty    Medical Decision Making  Medically screening exam initiated at 2:41 AM.  Appropriate orders placed.  Taner Rzepka was informed that the remainder of the evaluation will be completed by another provider, this initial triage assessment does not replace that evaluation, and the importance of remaining in the ED until their evaluation is complete.  Flank pain   Amaryllis Dyke, PA-C 08/31/22 0250

## 2022-08-31 NOTE — ED Provider Notes (Signed)
[facility] EMERGENCY DEPARTMENT Provider Note   CSN: [id number] Arrival date & time: 08/31/22  0226     History Chief Complaint  Patient presents with   Flank Pain    Randy Craig is a 41 y.o. male with history of kidney stones, hypertension, hyperlipidemia presents emerged department for evaluation of left flank pain waxing and waning since yesterday.  Patient also reports some hematuria but denies any urinary urgency, frequency, or dysuria.  He reports some nausea and vomiting however that is improved while he had Zofran in the waiting room.  He denies any fevers, recent cough or cold symptoms.  Denies any chest pain or shortness of breath.  Denies any abdominal pain, hematochezia, melena, hematemesis, or coffee-ground emesis.  He reports he had kidney stones before in the past few years prior.  He reports he seen a urologist however does not over the name.  Currently takes amlodipine and rosuvastatin for his medications.  No known drug allergies.  Denies any tobacco, EtOH, illicit drug use ever.   Flank Pain Pertinent negatives include no chest pain, no abdominal pain and no shortness of breath.       Home Medications Prior to Admission medications   Medication Sig Start Date End Date Taking? Authorizing Provider  amLODipine (NORVASC) 2.5 MG tablet TAKE 1 TABLET BY MOUTH EVERY DAY 12/05/21   Sueanne Margarita, MD  atorvastatin (LIPITOR) 40 MG tablet Take 1 tablet (40 mg total) by mouth daily. Please make overdue appt with Dr. Radford Pax before anymore refills. Thank you 2nd attempt 11/04/20   Sueanne Margarita, MD  Evolocumab (REPATHA SURECLICK) [id number] MG/ML SOAJ Inject 1 Dose into the skin every 14 (fourteen) days. 05/11/22   Hilty, Nadean Corwin, MD  ezetimibe (ZETIA) 10 MG tablet 1 tablet 09/10/20   [provider]      Allergies    Patient has no known allergies.    Review of Systems   Review of Systems  Constitutional:  Negative for chills and fever.   Respiratory:  Negative for shortness of breath.   Cardiovascular:  Negative for chest pain.  Gastrointestinal:  Positive for nausea and vomiting. Negative for abdominal pain, blood in stool, constipation and diarrhea.  Genitourinary:  Positive for flank pain and hematuria. Negative for dysuria, frequency and urgency.    Physical Exam Updated Vital Signs BP (!) 144/80 (BP Location: Right Arm)   Pulse 78   Temp 98.1 F (36.7 C) (Oral)   Resp 17   SpO2 100%  Physical Exam Vitals and nursing note reviewed.  Constitutional:      General: He is not in acute distress.    Appearance: Normal appearance. He is not ill-appearing or toxic-appearing.  HENT:     Head: Normocephalic and atraumatic.  Eyes:     General: No scleral icterus. Cardiovascular:     Rate and Rhythm: Normal rate and regular rhythm.  Pulmonary:     Effort: Pulmonary effort is normal. No respiratory distress.     Breath sounds: Normal breath sounds.  Abdominal:     General: Bowel sounds are normal. There is no distension.     Palpations: Abdomen is soft.     Tenderness: There is no abdominal tenderness. There is no right CVA tenderness, left CVA tenderness, guarding or rebound.  Musculoskeletal:        General: No deformity.     Cervical back: Normal range of motion.  Skin:    General: Skin is warm and dry.  Neurological:     General: No focal deficit present.     Mental Status: He is alert. Mental status is at baseline.     ED Results / Procedures / Treatments   Labs (all labs ordered are listed, but only abnormal results are displayed) Labs Reviewed  COMPREHENSIVE METABOLIC PANEL - Abnormal; Notable for the following components:      Result Value   Glucose, Bld 130 (*)    All other components within normal limits  CBC WITH DIFFERENTIAL/PLATELET - Abnormal; Notable for the following components:   WBC 12.1 (*)    Neutro Abs 10.6 (*)    All other components within normal limits  LIPASE, BLOOD   URINALYSIS, ROUTINE W REFLEX MICROSCOPIC    EKG None  Radiology CT Renal Stone Study  Result Date: 08/31/2022 CLINICAL DATA:  Flank pain.  Kidney stone suspected. EXAM: CT ABDOMEN AND PELVIS WITHOUT CONTRAST TECHNIQUE: Multidetector CT imaging of the abdomen and pelvis was performed following the standard protocol without IV contrast. RADIATION DOSE REDUCTION: This exam was performed according to the departmental dose-optimization program which includes automated exposure control, adjustment of the mA and/or kV according to patient size and/or use of iterative reconstruction technique. COMPARISON:  CT with IV contrast 04/18/2020 FINDINGS: Lower chest: Lung bases are clear. The cardiac size is normal. Mild chronic elevation right hemidiaphragm. Hepatobiliary: 21 cm length liver showing mild steatosis. No focal abnormality is seen without contrast. The gallbladder and bile ducts are unremarkable. Pancreas: Unremarkable without contrast. Spleen: Unremarkable without contrast.  Normal in size. Adrenals/Urinary Tract: There is no adrenal mass. No focal abnormality in the bilateral renal cortex. On the left there is asymmetric renal cortical edema and perinephric edema, and mild hydroureteronephrosis due to a 3 x 3 x 4 mm proximal ureteral stone at the level of L3-4. Both ureters are otherwise clear. No intrarenal stones are seen. The bladder is unremarkable for the degree of distention. Stomach/Bowel: No dilatation or wall thickening including of the appendix. Moderate stool retention ascending and transverse colon. Scattered sigmoid diverticulosis without diverticulitis. Vascular/Lymphatic: There is minimal calcification of the abdominal aorta and moderate patchy calcific plaques of the distal common iliac arteries and internal iliac arteries. No other significant vascular findings without contrast. There are no enlarged lymph nodes. Reproductive: Enlarged prostate 5 cm transverse diameter. Pelvic  phleboliths. Other: Small umbilical fat hernia. No incarcerated hernia. There is no free air, free hemorrhage or free fluid. No abscess is seen. Musculoskeletal: There is degenerative disc disease of the lumbar spine with mild spondylosis. Posterior disc protrusion effacing both lateral recesses at L4-5. No worrisome skeletal lesions. IMPRESSION: 1. 3 x 3 x 4 mm proximal left ureteral stone at the level of L3-4, with mild obstructive uropathy. Please correlate clinically for infectious complication. 2. No visible nephrolithiasis. 3. Constipation and diverticulosis. 4. Prostatomegaly. This is unusual at this age. Laboratory and clinical correlation advised. 5. Aortoiliac atherosclerosis. 6. L4-5 disc protrusion effacing the lateral recesses causing mild spinal stenosis. Electronically Signed   By: Telford Nab M.D.   On: 08/31/2022 03:51    Procedures Procedures   Medications Ordered in ED Medications  lactated ringers bolus 1,000 mL (has no administration in time range)  ketorolac (TORADOL) 15 MG/ML injection 15 mg (has no administration in time range)  ondansetron (ZOFRAN-ODT) disintegrating tablet 4 mg (4 mg Oral Given 08/31/22 0253)  oxyCODONE-acetaminophen (PERCOCET/ROXICET) 5-325 MG per tablet 1 tablet (1 tablet Oral Given 08/31/22 0252)    ED Course/ Medical Decision Making/  A&P                           Medical Decision Making Risk Prescription drug management.   41 year old male presents the emergency room and for evaluation of left flank pain and hematuria.  Differential diagnosis includes but is not limited to nephrolithiasis, UTI, exercise, external trauma, renal glomerular disease, benign prostatic hypertrophy, cancer.  Vital signs show slightly elevated blood pressure at 144/80, afebrile, normal pulse rate, satting well on room air without increased work of breathing.  Physical exam as noted above.  Care was delayed due to patient wait times.   Labs and imaging ordered in  triage.  Still awaiting patient's urine.  I independently reviewed and interpreted the patient's labs and imaging.  CMP shows mildly elevated glucose at 130 otherwise no electrolyte or LFT abnormality.  Lipase within normal limits.  CBC shows slightly elevated white blood cell count of 12.1 with a left shift.  No anemia or platelet abnormality.  Urinalysis shows urinalysis shows large amount of hemoglobin with greater than 50 red blood cells, rare bacteria however 0-5 white blood cells and no leukocytes or nitrites.  Not consistent with any UTI.  Likely has red blood cells from his kidney stone..  CT renal shows 1. 3 x 3 x 4 mm proximal left ureteral stone at the level of L3-4, with mild obstructive uropathy. Please correlate clinically for infectious complication. 2. No visible nephrolithiasis. 3. Constipation and diverticulosis. 4. Prostatomegaly. This is unusual at this age. Laboratory and clinical correlation advised. 5. Aortoiliac atherosclerosis. 6. L4-5 disc protrusion effacing the lateral recesses causing mild spinal stenosis.  Enlarged prostate seen on CT imaging.  Patient denies any trouble with his urine stream or any dysuria.  I doubt any prostatitis at this time.  Vitals are not consistent with any sepsis.  We will order the patient some fluids and Toradol.  He was given Percocet and Zofran while in the waiting room.  Still awaiting urinalysis from patient.  Urinalysis shows some hematuria, consistent with patient's kidney stones, he does have a slight leukocytosis however is most likely acute phase reaction.  He does not have any CVA tenderness or signs of infection in his urine.  Low suspicion for any pyelonephritis.  Vital signs are still stable.  He has not had any emesis and has been able to tolerate p.o. while in the emergency department.  I discussed with him his lab and imaging results.  Discussed that he will need to follow-up with urology for his enlarged prostate and also for his  kidney stones.  We will give him information for [facility].  In the meantime, we will send him home on Zofran, Flomax, and a few narcotic pain medications.  We discussed using Tylenol ibuprofen together and using the narcotic pain medication for breakthrough pain only.  Caution driving or operating her machinery on this medication.  We discussed strict return precautions and red flag symptoms.  Patient verbalizes understanding agrees the plan.  Patient is stable being discharged home in good condition.  I discussed this case with my attending physician who cosigned this note including patient's presenting symptoms, physical exam, and planned diagnostics and interventions. Attending physician stated agreement with plan or made changes to plan which were implemented.    Final Clinical Impression(s) / ED Diagnoses Final diagnoses:  Kidney stone  Enlarged prostate    Rx / DC Orders ED Discharge Orders  Ordered    oxyCODONE (ROXICODONE) 5 MG immediate release tablet  Every 4 hours PRN        08/31/22 0925    tamsulosin (FLOMAX) 0.4 MG CAPS capsule  Daily after supper        08/31/22 0925    ondansetron (ZOFRAN) 4 MG tablet  Every 6 hours        08/31/22 0925              Sherrell Puller, PA-C 08/31/22 6237    Godfrey Pick, MD 09/01/22 [phone]

## 2022-08-31 NOTE — Discharge Instructions (Addendum)
You were seen in the emergency department for evaluation of your flank pain and blood in your urine.  It was discovered that you have a kidney stone.  For this, I am starting you on Flomax (tamsulosin), this medication you will take daily.  I am also starting you on some Zofran which is an antinausea medication that you take as needed.  Additionally, I will send you home on a few narcotic pain medications that she can take for breakthrough pain.  Otherwise, would like you to take 600 mg of ibuprofen in the 1000 mg of Tylenol every 6 hours as needed for pain.  On imaging, it was shown that you have an enlarged prostate.  I would like for you to follow-up with this with the urologist as well as with your kidney stone.  I included information for [facility] if you cannot get in contact with your urologist who had previously.  Please call them today to schedule an appointment. Please make sure you are drinking plenty of water and staying well-hydrated.  Narcotic medication can also make you constipated, please make sure you are taking a stool softener such as MiraLAX if you have a tendency for constipation.  If you have any concerns, new or worsening symptoms, please return to the nearest emergency department for evaluation.  Contact a doctor if: You have pain that gets worse or does not get better with medicine. Get help right away if: You have a fever or chills. You get very bad pain. You get new pain in your belly (abdomen). You pass out (faint). You cannot pee.

## 2022-09-09 DIAGNOSIS — N201 Calculus of ureter: Secondary | ICD-10-CM | POA: Diagnosis not present

## 2022-12-01 DIAGNOSIS — I10 Essential (primary) hypertension: Secondary | ICD-10-CM | POA: Diagnosis not present

## 2022-12-01 DIAGNOSIS — Z23 Encounter for immunization: Secondary | ICD-10-CM | POA: Diagnosis not present

## 2023-01-17 ENCOUNTER — Other Ambulatory Visit: Payer: Self-pay | Admitting: Cardiology

## 2023-02-16 ENCOUNTER — Other Ambulatory Visit: Payer: Self-pay | Admitting: Internal Medicine

## 2023-02-16 ENCOUNTER — Other Ambulatory Visit (HOSPITAL_BASED_OUTPATIENT_CLINIC_OR_DEPARTMENT_OTHER): Payer: Self-pay

## 2023-02-16 DIAGNOSIS — M7661 Achilles tendinitis, right leg: Secondary | ICD-10-CM | POA: Diagnosis not present

## 2023-02-16 DIAGNOSIS — E7801 Familial hypercholesterolemia: Secondary | ICD-10-CM

## 2023-02-16 DIAGNOSIS — S90852A Superficial foreign body, left foot, initial encounter: Secondary | ICD-10-CM | POA: Diagnosis not present

## 2023-02-16 MED ORDER — REPATHA SURECLICK 140 MG/ML ~~LOC~~ SOAJ: 1.0000 mL | SUBCUTANEOUS | 1 refills | Status: DC

## 2023-02-17 ENCOUNTER — Ambulatory Visit
Admission: RE | Admit: 2023-02-17 | Discharge: 2023-02-17 | Disposition: A | Discharge status: Home / Self Care | Payer: BC Managed Care – PPO | Source: Ambulatory Visit | Attending: Family Medicine | Admitting: Family Medicine

## 2023-02-17 ENCOUNTER — Other Ambulatory Visit (HOSPITAL_COMMUNITY): Payer: Self-pay

## 2023-02-17 ENCOUNTER — Other Ambulatory Visit: Payer: Self-pay | Admitting: Family Medicine

## 2023-02-17 DIAGNOSIS — S90852A Superficial foreign body, left foot, initial encounter: Secondary | ICD-10-CM | POA: Diagnosis not present

## 2023-02-22 ENCOUNTER — Telehealth: Payer: Self-pay

## 2023-02-22 NOTE — Telephone Encounter (Signed)
Please note that we will need recent labs before proceeding with the p/a for repatha

## 2023-02-23 NOTE — Telephone Encounter (Signed)
Message sent to patient about lab work.

## 2023-02-26 NOTE — Telephone Encounter (Signed)
Patient viewed message. Waiting on labs for PA  [address]  MyChart User Last Read On  Randy Craig 02/25/2023  8:28 AM

## 2023-03-02 ENCOUNTER — Ambulatory Visit (INDEPENDENT_AMBULATORY_CARE_PROVIDER_SITE_OTHER): Payer: BC Managed Care – PPO

## 2023-03-02 ENCOUNTER — Ambulatory Visit (INDEPENDENT_AMBULATORY_CARE_PROVIDER_SITE_OTHER): Payer: BC Managed Care – PPO | Admitting: Podiatry

## 2023-03-02 DIAGNOSIS — M79672 Pain in left foot: Secondary | ICD-10-CM | POA: Diagnosis not present

## 2023-03-02 DIAGNOSIS — L02612 Cutaneous abscess of left foot: Secondary | ICD-10-CM

## 2023-03-02 DIAGNOSIS — T1490XA Injury, unspecified, initial encounter: Secondary | ICD-10-CM

## 2023-03-02 DIAGNOSIS — M795 Residual foreign body in soft tissue: Secondary | ICD-10-CM

## 2023-03-02 DIAGNOSIS — E7801 Familial hypercholesterolemia: Secondary | ICD-10-CM | POA: Diagnosis not present

## 2023-03-02 MED ORDER — DOXYCYCLINE HYCLATE 100 MG PO TABS: 100.0000 mg | ORAL_TABLET | Freq: Two times a day (BID) | ORAL | 0 refills | Status: AC

## 2023-03-02 NOTE — Progress Notes (Signed)
Subjective:  Patient ID: Randy Craig, male    DOB: [birth year],  MRN: [id number]  Chief Complaint  Patient presents with   Foot Injury    Patient stepped on some glass and pierced the ball of the foot, problem occurred 3 weeks ago, pain occurs when walking or when pressure is applied     42 y.o. male presents with concern for pain in the left plantar forefoot near the ball of the foot.  He says that he stepped on some glass a while ago.  He is concerned that there may still be a shard present in the plantar forefoot.  He has pain with pressure on the area and when walking and stepping on the area.  He has seen his primary care doctor who tried to shave some of the skin off the callused area and see if anything could be removed but was unable to get anything out.  Past Medical History:  Diagnosis Date   ADHD    Aortic atherosclerosis (HCC)    Bilateral buttock pain    GERD (gastroesophageal reflux disease)    Gynecomastia, male    Herpes simplex type 2 infection    Inattention    Obesity    Onychomycosis    Pure hypercholesterolemia    Right wrist pain 09/2013    No Known Allergies  ROS: Negative except as per HPI above  Objective:  General: AAO x3, NAD  Dermatological: Attention directed to the left plantar forefoot there is noted to be a hyperkeratotic lesion with tan/dark discolored area underlying.  Upon debridement of some of the overlying hyperkeratotic tissue there is noted to be a small amount of purulence that was expressed from the area indicating small cutaneous abscess likely related to foreign body reaction/granuloma.  Not able to visually identify any obvious glass foreign body.  Pain on palpation of the area.  Vascular:  Dorsalis Pedis artery and Posterior Tibial artery pedal pulses are 2/4 bilateral.  Capillary fill time < 3 sec to all digits.   Neruologic: Grossly intact via light touch bilateral. Protective threshold intact to all sites bilateral.    Musculoskeletal: No gross boney pedal deformities bilateral. No pain, crepitus, or limitation noted with foot and ankle range of motion bilateral. Muscular strength 5/5 in all groups tested bilateral.  Gait: Unassisted, Nonantalgic.   No images are attached to the encounter.   Assessment:   1. Residual foreign body in soft tissue   2. Abscess of left foot   3. Injury      Plan:  Patient was evaluated and treated and all questions answered.  # Retained foreign body in the left plantar forefoot -Discussed with patient I do believe he has a piece of retained glass in the left plantar forefoot. -At this point I recommend trying to proceed with an office removal.  Patient was agreeable.  After injection of 2% lidocaine the plantar foot after prep with Betadine I made a 0.5 cm superficial incision through the dermis to see if I can locate any superficial foreign body.  Was unable to get any foreign body out however I did drain a small collection of infected fluid and tissue consistent with a small foreign body granuloma/abscess.  Patient tolerated well.  Steri-Strips were applied for closure and adhesive bandage was applied -As we are not able to get out any foreign body I recommend an MRI to assess for possible retained foreign body in the left plantar forefoot. -I also discussed that if the MRI does  come back with a foreign body we will have to consider removal in the OR as I do not know how deep the piece is and also concern for bleeding risk with further dissection. -We will proceed with MRI and patient will follow-up to discuss results -eRx for doxycycline 100 mg twice daily for 7 days for prophylaxis against worsening infection however no clinical signs of residual infection after debridement of superficial cutaneous abscess  Return in about 2 weeks (around 03/16/2023) for f/u MRI.          Corinna Gab, DPM [facility] / [facility]

## 2023-03-03 LAB — NMR, LIPOPROFILE
Cholesterol, Total: 236 mg/dL — ABNORMAL HIGH (ref 100–199)
HDL Particle Number: 30.5 umol/L (ref >=30.5)
HDL-C: 55 mg/dL (ref >39)
LDL Particle Number: 2066 nmol/L — ABNORMAL HIGH (ref <1000)
LDL Size: 21.4 nm (ref >20.5)
LDL-C (NIH Calc): 167 mg/dL — ABNORMAL HIGH (ref 0–99)
LP-IR Score: 37 (ref <=45)
Small LDL Particle Number: 746 nmol/L — ABNORMAL HIGH (ref <=527)
Triglycerides: 79 mg/dL (ref 0–149)

## 2023-03-03 LAB — LIPOPROTEIN A (LPA): Lipoprotein (a): 71.1 nmol/L (ref <75.0)

## 2023-03-05 NOTE — Telephone Encounter (Signed)
PA submitted (Key: [id number])

## 2023-03-09 DIAGNOSIS — M25571 Pain in right ankle and joints of right foot: Secondary | ICD-10-CM | POA: Diagnosis not present

## 2023-03-09 DIAGNOSIS — S86091S Other specified injury of right Achilles tendon, sequela: Secondary | ICD-10-CM | POA: Diagnosis not present

## 2023-03-22 DIAGNOSIS — M25571 Pain in right ankle and joints of right foot: Secondary | ICD-10-CM | POA: Diagnosis not present

## 2023-03-30 DIAGNOSIS — M7661 Achilles tendinitis, right leg: Secondary | ICD-10-CM | POA: Diagnosis not present

## 2023-04-21 DIAGNOSIS — M7661 Achilles tendinitis, right leg: Secondary | ICD-10-CM | POA: Diagnosis not present

## 2023-04-26 DIAGNOSIS — M7661 Achilles tendinitis, right leg: Secondary | ICD-10-CM | POA: Diagnosis not present

## 2023-05-03 DIAGNOSIS — M7661 Achilles tendinitis, right leg: Secondary | ICD-10-CM | POA: Diagnosis not present

## 2023-05-05 DIAGNOSIS — M7661 Achilles tendinitis, right leg: Secondary | ICD-10-CM | POA: Diagnosis not present

## 2023-05-19 DIAGNOSIS — M7661 Achilles tendinitis, right leg: Secondary | ICD-10-CM | POA: Diagnosis not present

## 2023-05-20 DIAGNOSIS — R3912 Poor urinary stream: Secondary | ICD-10-CM | POA: Diagnosis not present

## 2023-05-20 DIAGNOSIS — N411 Chronic prostatitis: Secondary | ICD-10-CM | POA: Diagnosis not present

## 2023-05-20 DIAGNOSIS — R300 Dysuria: Secondary | ICD-10-CM | POA: Diagnosis not present

## 2023-05-21 DIAGNOSIS — M7661 Achilles tendinitis, right leg: Secondary | ICD-10-CM | POA: Diagnosis not present

## 2023-05-26 DIAGNOSIS — M7661 Achilles tendinitis, right leg: Secondary | ICD-10-CM | POA: Diagnosis not present

## 2023-05-28 DIAGNOSIS — M7661 Achilles tendinitis, right leg: Secondary | ICD-10-CM | POA: Diagnosis not present

## 2023-06-01 DIAGNOSIS — M7661 Achilles tendinitis, right leg: Secondary | ICD-10-CM | POA: Diagnosis not present

## 2023-06-03 DIAGNOSIS — M7661 Achilles tendinitis, right leg: Secondary | ICD-10-CM | POA: Diagnosis not present

## 2023-06-07 DIAGNOSIS — M7661 Achilles tendinitis, right leg: Secondary | ICD-10-CM | POA: Diagnosis not present

## 2023-06-08 ENCOUNTER — Ambulatory Visit (HOSPITAL_BASED_OUTPATIENT_CLINIC_OR_DEPARTMENT_OTHER): Payer: BC Managed Care – PPO | Admitting: Internal Medicine

## 2023-06-09 DIAGNOSIS — M7661 Achilles tendinitis, right leg: Secondary | ICD-10-CM | POA: Diagnosis not present

## 2023-09-19 ENCOUNTER — Emergency Department (HOSPITAL_COMMUNITY): Payer: BC Managed Care – PPO

## 2023-09-19 ENCOUNTER — Other Ambulatory Visit: Payer: Self-pay

## 2023-09-19 ENCOUNTER — Emergency Department (HOSPITAL_COMMUNITY)
Admission: EM | Admit: 2023-09-19 | Discharge: 2023-09-19 | Disposition: A | Discharge status: Home / Self Care | Payer: BC Managed Care – PPO | Attending: Emergency Medicine | Admitting: Emergency Medicine

## 2023-09-19 ENCOUNTER — Encounter (HOSPITAL_COMMUNITY): Payer: Self-pay | Admitting: Emergency Medicine

## 2023-09-19 DIAGNOSIS — K820 Obstruction of gallbladder: Secondary | ICD-10-CM | POA: Diagnosis not present

## 2023-09-19 DIAGNOSIS — M545 Low back pain, unspecified: Secondary | ICD-10-CM | POA: Insufficient documentation

## 2023-09-19 DIAGNOSIS — R109 Unspecified abdominal pain: Secondary | ICD-10-CM | POA: Insufficient documentation

## 2023-09-19 DIAGNOSIS — M549 Dorsalgia, unspecified: Secondary | ICD-10-CM | POA: Diagnosis not present

## 2023-09-19 LAB — CBC
HCT: 48.1 % (ref 39.0–52.0)
Hemoglobin: 15.1 g/dL (ref 13.0–17.0)
MCH: 26.2 pg (ref 26.0–34.0)
MCHC: 31.4 g/dL (ref 30.0–36.0)
MCV: 83.4 fL (ref 80.0–100.0)
Platelets: 246 10*3/uL (ref 150–400)
RBC: 5.77 MIL/uL (ref 4.22–5.81)
RDW: 13 % (ref 11.5–15.5)
WBC: 8.7 10*3/uL (ref 4.0–10.5)
nRBC: 0 % (ref 0.0–0.2)

## 2023-09-19 LAB — URINALYSIS, ROUTINE W REFLEX MICROSCOPIC
Bacteria, UA: NONE SEEN
Bilirubin Urine: NEGATIVE
Glucose, UA: NEGATIVE mg/dL
Ketones, ur: NEGATIVE mg/dL
Leukocytes,Ua: NEGATIVE
Nitrite: NEGATIVE
Protein, ur: NEGATIVE mg/dL
Specific Gravity, Urine: 1.025 (ref 1.005–1.030)
pH: 5 (ref 5.0–8.0)

## 2023-09-19 LAB — BASIC METABOLIC PANEL
Anion gap: 8 (ref 5–15)
BUN: 12 mg/dL (ref 6–20)
CO2: 27 mmol/L (ref 22–32)
Calcium: 9.6 mg/dL (ref 8.9–10.3)
Chloride: 103 mmol/L (ref 98–111)
Creatinine, Ser: 0.86 mg/dL (ref 0.61–1.24)
GFR, Estimated: >60 mL/min (ref >60)
Glucose, Bld: 123 mg/dL — ABNORMAL HIGH (ref 70–99)
Potassium: 3.7 mmol/L (ref 3.5–5.1)
Sodium: 138 mmol/L (ref 135–145)

## 2023-09-19 MED ORDER — SODIUM CHLORIDE 0.9 % IV BOLUS
500.0000 mL | Freq: Once | INTRAVENOUS | Status: AC
  Administered 2023-09-19: 500 mL via INTRAVENOUS

## 2023-09-19 MED ORDER — KETOROLAC TROMETHAMINE 15 MG/ML IJ SOLN
15.0000 mg | Freq: Once | INTRAMUSCULAR | Status: AC
  Administered 2023-09-19: 15 mg via INTRAVENOUS
  Filled 2023-09-19: qty 1

## 2023-09-19 NOTE — ED Provider Notes (Signed)
[facility] AT [facility] Provider Note   CSN: [id number] Arrival date & time: 09/19/23  4098     History  Chief Complaint  Patient presents with   Back Pain    Randy Craig is a 42 y.o. male.  42 year old male presents today for concern of metallic taste in his mouth for the past 4 days as well as back pain that started today.  Does have history of kidney stones.  Denies abdominal pain, nausea, vomiting.  No difficulty urinating.  Denies other complaints.  He states he spoke to his dad who mentioned that it could be a kidney problem or his thyroid.  He does have a PCP.  The history is provided by the patient. No language interpreter was used.       Home Medications Prior to Admission medications   Medication Sig Start Date End Date Taking? Authorizing Provider  amLODipine (NORVASC) 2.5 MG tablet TAKE 1 TABLET BY MOUTH EVERY DAY 12/05/21   Quintella Reichert, MD  atorvastatin (LIPITOR) 40 MG tablet Take 1 tablet (40 mg total) by mouth daily. Please make overdue appt with Dr. Mayford Knife before anymore refills. Thank you 2nd attempt 11/04/20   Quintella Reichert, MD  Evolocumab (REPATHA SURECLICK) 140 MG/ML SOAJ Inject 140 mg into the skin every 14 (fourteen) days. 02/16/23   Hilty, Lisette Abu, MD  ezetimibe (ZETIA) 10 MG tablet 1 tablet 09/10/20   [provider]  ondansetron (ZOFRAN) 4 MG tablet Take 1 tablet (4 mg total) by mouth every 6 (six) hours. 08/31/22   Achille Rich, PA-C  oxyCODONE (ROXICODONE) 5 MG immediate release tablet Take 1 tablet (5 mg total) by mouth every 4 (four) hours as needed for severe pain. 08/31/22   Achille Rich, PA-C  tamsulosin (FLOMAX) 0.4 MG CAPS capsule Take 1 capsule (0.4 mg total) by mouth daily after supper. 08/31/22   Achille Rich, PA-C      Allergies    Patient has no known allergies.    Review of Systems   Review of Systems  Constitutional:  Negative for chills and fever.  Respiratory:  Negative for  shortness of breath.   Gastrointestinal:  Negative for abdominal pain, nausea and vomiting.  Genitourinary:  Positive for flank pain. Negative for dysuria and hematuria.  Neurological:  Negative for light-headedness.  All other systems reviewed and are negative.   Physical Exam Updated Vital Signs BP (!) 133/92   Pulse 71   Temp 98.3 F (36.8 C) (Oral)   Resp 18   SpO2 98%  Physical Exam Vitals and nursing note reviewed.  Constitutional:      General: He is not in acute distress.    Appearance: Normal appearance. He is not ill-appearing.  HENT:     Head: Normocephalic and atraumatic.     Nose: Nose normal.  Eyes:     Conjunctiva/sclera: Conjunctivae normal.  Cardiovascular:     Rate and Rhythm: Normal rate.  Pulmonary:     Effort: Pulmonary effort is normal. No respiratory distress.  Abdominal:     General: There is no distension.     Palpations: Abdomen is soft.     Tenderness: There is no abdominal tenderness. There is no right CVA tenderness, left CVA tenderness or guarding.  Musculoskeletal:        General: No deformity. Normal range of motion.     Cervical back: Normal range of motion.  Skin:    Findings: No rash.  Neurological:  Mental Status: He is alert.     ED Results / Procedures / Treatments   Labs (all labs ordered are listed, but only abnormal results are displayed) Labs Reviewed  BASIC METABOLIC PANEL - Abnormal; Notable for the following components:      Result Value   Glucose, Bld 123 (*)    All other components within normal limits  URINALYSIS, ROUTINE W REFLEX MICROSCOPIC - Abnormal; Notable for the following components:   Hgb urine dipstick SMALL (*)    All other components within normal limits  CBC    EKG None  Radiology No results found.  Procedures Procedures    Medications Ordered in ED Medications  sodium chloride 0.9 % bolus 500 mL (has no administration in time range)  ketorolac (TORADOL) 15 MG/ML injection 15 mg (has  no administration in time range)    ED Course/ Medical Decision Making/ A&P                                 Medical Decision Making Amount and/or Complexity of Data Reviewed Labs: ordered. Radiology: ordered.  Risk Prescription drug management.   Medical Decision Making / ED Course   This patient presents to the ED for concern of low back pain, this involves an extensive number of treatment options, and is a complaint that carries with it a high risk of complications and morbidity.  The differential diagnosis includes muscle strain, kidney stone, pyelonephritis, UTI  MDM: 42 year old male presents today for low back pain and metallic taste in his mouth.  No other concerns.  Hemodynamically stable.  No abdominal tenderness.  CBC is unremarkable, BMP with glucose 123 otherwise unremarkable.  UA shows small hemoglobin otherwise no acute concern.  CT renal stone study without acute finding.  No kidney stone.  He does have history of kidney stone.  Symptomatic management discussed.  Discharged in stable condition.  He will follow-up with PCP for reevaluation.  Patient voices understanding and is in agreement with plan.  Lab Tests: -I ordered, reviewed, and interpreted labs.   The pertinent results include:   Labs Reviewed  BASIC METABOLIC PANEL - Abnormal; Notable for the following components:      Result Value   Glucose, Bld 123 (*)    All other components within normal limits  URINALYSIS, ROUTINE W REFLEX MICROSCOPIC - Abnormal; Notable for the following components:   Hgb urine dipstick SMALL (*)    All other components within normal limits  CBC      EKG  EKG Interpretation Date/Time:    Ventricular Rate:    PR Interval:    QRS Duration:    QT Interval:    QTC Calculation:   R Axis:      Text Interpretation:           Imaging Studies ordered: I ordered imaging studies including ct renal stone study I independently visualized and interpreted imaging. I agree  with the radiologist interpretation   Medicines ordered and prescription drug management: Meds ordered this encounter  Medications   sodium chloride 0.9 % bolus 500 mL   ketorolac (TORADOL) 15 MG/ML injection 15 mg    -I have reviewed the patients home medicines and have made adjustments as needed   Reevaluation: After the interventions noted above, I reevaluated the patient and found that they have :improved  Co morbidities that complicate the patient evaluation  Past Medical History:  Diagnosis Date  ADHD    Aortic atherosclerosis (HCC)    Bilateral buttock pain    GERD (gastroesophageal reflux disease)    Gynecomastia, male    Herpes simplex type 2 infection    Inattention    Obesity    Onychomycosis    Pure hypercholesterolemia    Right wrist pain 09/2013      Dispostion: Discharged in stable condition.  Return precaution discussed.  Fluid bolus consulted.  Discussed increasing his hydration and following up with PCP.  Final Clinical Impression(s) / ED Diagnoses Final diagnoses:  Low back pain without sciatica, unspecified back pain laterality, unspecified chronicity    Rx / DC Orders ED Discharge Orders     None         Marita Kansas, PA-C 09/19/23 2325    Alvira Monday, MD 09/20/23 2218

## 2023-09-19 NOTE — Discharge Instructions (Signed)
Your workup today is without any concerning findings.  CT scan did not show any worrisome findings.  Take Tylenol Motrin as you need to.  Blood work was reassuring.  Follow-up with your PCP.

## 2023-09-19 NOTE — ED Triage Notes (Signed)
Pt reports lower back pain that began today. Pt also reports metallic taste in mouth. Pt also reports lightheadedness. Denies any other symptoms

## 2023-09-19 NOTE — ED Notes (Signed)
Pt is being transported to CT.

## 2023-09-27 ENCOUNTER — Encounter: Payer: Self-pay | Admitting: Podiatry

## 2023-09-27 ENCOUNTER — Other Ambulatory Visit (INDEPENDENT_AMBULATORY_CARE_PROVIDER_SITE_OTHER): Payer: Self-pay | Admitting: Podiatry

## 2023-09-27 DIAGNOSIS — M795 Residual foreign body in soft tissue: Secondary | ICD-10-CM

## 2023-10-14 ENCOUNTER — Encounter: Payer: Self-pay | Admitting: Podiatry

## 2023-10-20 ENCOUNTER — Ambulatory Visit
Admission: RE | Admit: 2023-10-20 | Discharge: 2023-10-20 | Disposition: A | Discharge status: Home / Self Care | Payer: BC Managed Care – PPO | Source: Ambulatory Visit | Attending: Podiatry | Admitting: Podiatry

## 2023-10-20 DIAGNOSIS — M795 Residual foreign body in soft tissue: Secondary | ICD-10-CM

## 2023-12-28 ENCOUNTER — Telehealth: Payer: Self-pay | Admitting: Podiatry

## 2023-12-28 NOTE — Telephone Encounter (Signed)
Pt calling about the mri results he got a while back.  He also said he is having pain on his right foot from a callus. I did schedule him to see Dr Carlota Raspberry this Friday in [place] as he is an Copywriter, advertising pt.

## 2023-12-31 ENCOUNTER — Ambulatory Visit (INDEPENDENT_AMBULATORY_CARE_PROVIDER_SITE_OTHER): Payer: BC Managed Care – PPO | Admitting: Podiatry

## 2023-12-31 DIAGNOSIS — L84 Corns and callosities: Secondary | ICD-10-CM | POA: Diagnosis not present

## 2023-12-31 DIAGNOSIS — M21961 Unspecified acquired deformity of right lower leg: Secondary | ICD-10-CM | POA: Diagnosis not present

## 2023-12-31 DIAGNOSIS — M21962 Unspecified acquired deformity of left lower leg: Secondary | ICD-10-CM

## 2023-12-31 DIAGNOSIS — B351 Tinea unguium: Secondary | ICD-10-CM

## 2023-12-31 MED ORDER — CICLOPIROX 8 % EX SOLN: Freq: Every day | CUTANEOUS | 11 refills | Status: DC

## 2023-12-31 NOTE — Progress Notes (Signed)
 Chief Complaint  Patient presents with   Callouses    MRI results of left foot, callouses bilaterally. Not diabetic not anti coag.    HPI: 43 y.o. male presents today with concern of painful calluses bilateral plantar forefoot.    He had been sent for an MRI which was ordered in April 2024 by Dr. Annamary Rummage, not performed until December 2024, and needs reviewed today.  There was question of whether there might be a foreign body in the left foot.    He also has concern of nail fungus.  He is interested in treating the toenail fungus at this time.  Past Medical History:  Diagnosis Date   ADHD    Aortic atherosclerosis (HCC)    Bilateral buttock pain    GERD (gastroesophageal reflux disease)    Gynecomastia, male    Herpes simplex type 2 infection    Inattention    Obesity    Onychomycosis    Pure hypercholesterolemia    Right wrist pain 09/2013    Past Surgical History:  Procedure Laterality Date   NO PAST SURGERIES     WRIST ARTHROSCOPY WITH FOVEAL TRIANGULAR FIBROCARTILAGE COMPLEX REPAIR Left 10/09/2013   Procedure: RIGHT WRIST ARTHROSCOPY TFCC REPAIR    ;  Surgeon: Jodi Marble, MD;  Location: [facility];  Service: Orthopedics;  Laterality: Left;    No Known Allergies   Physical Exam: Palpable pedal pulses bilateral.  There hyperkeratotic lesions submet 2 and submet 3 bilateral.  The submet 3 calluses do have a focal corn within the callus with more pain on palpation to those areas.  No evidence of foreign body is noted.  The hallux nails show some yellow discoloration, subungual debris, distal onycholysis, and pain with compression.  This is consistent with onychomycosis.  ----------------------------------------------------------------- MRI from 10/20/23: Narrative & Impression  CLINICAL DATA:  Soft tissue mass of the foot.  No prior surgery.   EXAM: MRI OF THE LEFT FOOT WITHOUT CONTRAST   TECHNIQUE: Multiplanar, multisequence MR imaging of  the left foot was performed. No intravenous contrast was administered.   COMPARISON:  None Available.   FINDINGS: Bones/Joint/Cartilage   No fracture or dislocation. Normal alignment. No joint effusion. Mild subchondral marrow edema of the medial aspect of the second metatarsal base at the articulation with the middle cuneiform.   Ligaments   Collateral ligaments are intact.  Lisfranc ligament is intact.   Muscles and Tendons   Flexor, peroneal and extensor compartment tendons are intact. Muscles are normal.   Soft tissue No fluid collection or hematoma. Ill-defined intermediate signal soft tissue prominence in the plantar subcutaneous fat overlying the second metatarsal head concerning for scarring or foreign body reaction measuring approximately 8 x 7 mm.   IMPRESSION: 1. Ill-defined intermediate signal soft tissue prominence in the plantar subcutaneous fat overlying the second metatarsal head concerning for scarring or foreign body reaction measuring approximately 8 x 7 mm. 2. Mild subchondral marrow edema of the medial aspect of the second metatarsal base at the articulation with the middle cuneiform.     Electronically Signed   By: Elige Ko M.D.   On: 11/01/2023 13:07  ----------------------------------------------------------------  Assessment/Plan of Care: 1. Callus of foot   2. Dermatophytosis of nail   3. Metatarsal deformity, left   4. Metatarsal deformity, right       Meds ordered this encounter  Medications   ciclopirox (PENLAC) 8 % solution    Sig: Apply topically at bedtime. Apply  over nail. Apply daily over previous coat. Remove weekly with polish remover.    Dispense:  6.6 mL    Refill:  11   ORTHOTICS, PODIATRY (AMBULATORY)  Discussed clinical findings with patient today.  The hyperkeratotic lesions were shaved with a sterile #313 blade, and Salinocaine ointment was applied to the submet 3 corns followed by Band-Aid for occlusion.  He  will remove this at that time.  Informed the patient he would benefit from custom orthotics because we would be able to offload the second and third metatarsal heads where he has all of this discomfort.  This will cut down on his pain and decrease the callus buildup.  Will get him set up with our pedorthist for an orthotic consult.  He was given an orthotic estimate form to call his insurance and check on benefits prior to that consultation.  Order was placed for orthotics today.  Recommend urea 40 cream with 2% salicylic acid to apply to the calluses nightly.  Discussed the more mild involvement of the fungus in the nails.  I do not recommend oral medication at this time due to the risks with taking oral medication, since the nail fungus is not significant or throughout all 10 toenails.  We agreed to start with topical therapy.  Prescription for ciclopirox solution was sent to his pharmacy to apply to the nails daily.  Discussed the MRI results with the patient today.  There was ill-defined soft tissue prominence near the second metatarsal head which was concerning for scarring or foreign body reaction which measured 8 x 7 mm.  No findings today were consistent with any retained foreign body.  This could have been scar tissue from the previous attempted excision of foreign body by Dr. Annamary Rummage last year.  At this point, no further action needed.  Follow-up in 3 to 4 months for recheck for nail fungus and recheck calluses.   Clerance Lav, DPM, FACFAS [facility]     [address] [place], [place] [address]                Office [phone]  Fax [phone]

## 2024-01-07 ENCOUNTER — Telehealth: Payer: Self-pay

## 2024-01-07 NOTE — Telephone Encounter (Signed)
 PA for ciclopirox was denied due to the fact there is no documentation of trial and failure of oral antifungal therapy such as terbinafine,itraconazole and there is no documentation that patient is unable to take those medications

## 2024-01-20 ENCOUNTER — Telehealth: Payer: Self-pay

## 2024-01-20 DIAGNOSIS — B351 Tinea unguium: Secondary | ICD-10-CM

## 2024-01-20 NOTE — Telephone Encounter (Signed)
 PA for ciclopirox denied after sending additonal clinical information

## 2024-02-07 ENCOUNTER — Ambulatory Visit (INDEPENDENT_AMBULATORY_CARE_PROVIDER_SITE_OTHER): Payer: BC Managed Care – PPO

## 2024-02-07 DIAGNOSIS — M21962 Unspecified acquired deformity of left lower leg: Secondary | ICD-10-CM

## 2024-02-07 DIAGNOSIS — M21961 Unspecified acquired deformity of right lower leg: Secondary | ICD-10-CM | POA: Diagnosis not present

## 2024-02-07 DIAGNOSIS — L84 Corns and callosities: Secondary | ICD-10-CM | POA: Diagnosis not present

## 2024-02-07 NOTE — Progress Notes (Signed)
 Orthotics   Patient was present and evaluated for Custom molded foot orthotics. Patient will benefit from CFO's to provide total contact to BIL MLA's helping to balance and distribute body weight more evenly across BIL feet helping to reduce plantar pressure and pain. Orthotic will also encourage FF / RF alignment  Patient was scanned today and will return for fitting upon receipt

## 2024-03-15 ENCOUNTER — Ambulatory Visit (INDEPENDENT_AMBULATORY_CARE_PROVIDER_SITE_OTHER): Admitting: Podiatry

## 2024-03-15 DIAGNOSIS — D2371 Other benign neoplasm of skin of right lower limb, including hip: Secondary | ICD-10-CM

## 2024-03-15 NOTE — Progress Notes (Signed)
  Chief Complaint  Patient presents with   Callouses    Right foot callous is hurting badly again. He has been using the cream we suggested. ABN is signed, not diabetic and no anti coag.    HPI: 43 y.o. male presents today for follow-up of his right submet 4 skin lesion on the plantar aspect of the foot.  States it has been quite tender since last seen.  All of his other issues have been improving, according to the patient.  Patient states he was casted for orthotics back in March but has not heard back from anyone at our office for the status of his orthotics.  Will reach out to our orthotist while the patient is in the building.  Past Medical History:  Diagnosis Date   ADHD    Aortic atherosclerosis (HCC)    Bilateral buttock pain    GERD (gastroesophageal reflux disease)    Gynecomastia, male    Herpes simplex type 2 infection    Inattention    Obesity    Onychomycosis    Pure hypercholesterolemia    Right wrist pain 09/2013   Past Surgical History:  Procedure Laterality Date   NO PAST SURGERIES     WRIST ARTHROSCOPY WITH FOVEAL TRIANGULAR FIBROCARTILAGE COMPLEX REPAIR Left 10/09/2013   Procedure: RIGHT WRIST ARTHROSCOPY TFCC REPAIR    ;  Surgeon: Sheryl Donna, MD;  Location: [facility];  Service: Orthopedics;  Laterality: Left;   No Known Allergies   Physical Exam: Palpable pedal pulses noted.  There is a focal hyperkeratotic lesion right submet 4.  This pain on palpation of the lesion.  No surrounding erythema or evidence of foreign body is noted  Assessment/Plan of Care: 1. Benign neoplasm of skin of right foot    The skin lesion was shaved with sterile #313 blade.  2 applications of Cantharone solution were applied to the area followed by a Band-Aid for occlusion.  He will remove the Band-Aid in approximately 5 to 7 hours.  He may develop a blister tomorrow or the next day.  While the patient was in the office, we did not hear back from the orthotist  regarding the status of his orthotics.  Will try to find out within the next day or two and reach out to the patient.  Follow-up in 6 weeks for recheck   Robert Sperl Jenita Miyamoto, DPM, FACFAS [facility]     [address] [place], [place] [address]                Office [phone]  Fax [phone]

## 2024-03-17 ENCOUNTER — Ambulatory Visit: Admitting: Podiatry

## 2024-03-22 ENCOUNTER — Ambulatory Visit

## 2024-03-22 NOTE — Progress Notes (Signed)
 Patient presents today to pick up custom molded foot orthotics, diagnosed with deformity  by Dr. Rosemarie Conquest.   Orthotics were dispensed and fit was satisfactory. Reviewed instructions for break-in and wear. Written instructions given to patient.  Patient will follow up as needed.  Britton Cane

## 2024-04-26 ENCOUNTER — Encounter: Admitting: Podiatry

## 2024-04-26 NOTE — Progress Notes (Signed)
Patient did not show for scheduled appointment today.

## 2024-12-06 ENCOUNTER — Other Ambulatory Visit: Payer: Self-pay

## 2024-12-06 ENCOUNTER — Ambulatory Visit (HOSPITAL_COMMUNITY)
Admission: EM | Admit: 2024-12-06 | Discharge: 2024-12-06 | Disposition: A | Discharge status: Home / Self Care | Attending: Family Medicine | Admitting: Family Medicine

## 2024-12-06 DIAGNOSIS — M25512 Pain in left shoulder: Secondary | ICD-10-CM | POA: Diagnosis present

## 2024-12-06 DIAGNOSIS — R42 Dizziness and giddiness: Secondary | ICD-10-CM | POA: Insufficient documentation

## 2024-12-06 LAB — COMPREHENSIVE METABOLIC PANEL WITH GFR
ALT: 33 U/L (ref 0–44)
AST: 25 U/L (ref 15–41)
Albumin: 4.6 g/dL (ref 3.5–5.0)
Alkaline Phosphatase: 59 U/L (ref 38–126)
Anion gap: 17 — ABNORMAL HIGH (ref 5–15)
BUN: 13 mg/dL (ref 6–20)
CO2: 25 mmol/L (ref 22–32)
Calcium: 9.6 mg/dL (ref 8.9–10.3)
Chloride: 98 mmol/L (ref 98–111)
Creatinine, Ser: 0.97 mg/dL (ref 0.61–1.24)
GFR, Estimated: >60 mL/min
Glucose, Bld: 66 mg/dL — ABNORMAL LOW (ref 70–99)
Potassium: 4 mmol/L (ref 3.5–5.1)
Sodium: 140 mmol/L (ref 135–145)
Total Bilirubin: 1.7 mg/dL — ABNORMAL HIGH (ref 0.0–1.2)
Total Protein: 8 g/dL (ref 6.5–8.1)

## 2024-12-06 LAB — CBC
HCT: 47.1 % (ref 39.0–52.0)
Hemoglobin: 15.3 g/dL (ref 13.0–17.0)
MCH: 26 pg (ref 26.0–34.0)
MCHC: 32.5 g/dL (ref 30.0–36.0)
MCV: 80 fL (ref 80.0–100.0)
Platelets: 260 10*3/uL (ref 150–400)
RBC: 5.89 MIL/uL — ABNORMAL HIGH (ref 4.22–5.81)
RDW: 12.6 % (ref 11.5–15.5)
WBC: 8 10*3/uL (ref 4.0–10.5)
nRBC: 0 % (ref 0.0–0.2)

## 2024-12-06 NOTE — Discharge Instructions (Addendum)
You have had labs (blood work) sent today. We will call you with any significant abnormalities or if there is need to begin or change treatment or pursue further follow up.  You may also review your test results online through [facility]. If you do not have a MyChart account, instructions to sign up should be on your discharge paperwork.

## 2024-12-06 NOTE — ED Triage Notes (Signed)
 PT reports for last 2 days he has had soreness and numbness to LT shoulder that goes down the arm. Pt does not remember any injury.

## 2024-12-07 NOTE — ED Provider Notes (Signed)
 " [facility]   [id number] 12/06/24 Arrival Time: 1420  ASSESSMENT & PLAN:  1. Acute pain of left shoulder   2. Episodic lightheadedness    L shoulder likely strain. OTC ibuprofen for the next several days. Encourage movement. Labs for intermittent lightheadedness. No significant abnormalities noted.  Results for orders placed or performed during the hospital encounter of 12/06/24  CBC   Collection Time: 12/06/24  5:09 PM  Result Value Ref Range   WBC 8.0 4.0 - 10.5 K/uL   RBC 5.89 (H) 4.22 - 5.81 MIL/uL   Hemoglobin 15.3 13.0 - 17.0 g/dL   HCT 52.8 60.9 - 47.9 %   MCV 80.0 80.0 - 100.0 fL   MCH 26.0 26.0 - 34.0 pg   MCHC 32.5 30.0 - 36.0 g/dL   RDW 87.3 88.4 - 84.4 %   Platelets 260 150 - 400 K/uL   nRBC 0.0 0.0 - 0.2 %  Comprehensive metabolic panel with GFR   Collection Time: 12/06/24  5:09 PM  Result Value Ref Range   Sodium 140 135 - 145 mmol/L   Potassium 4.0 3.5 - 5.1 mmol/L   Chloride 98 98 - 111 mmol/L   CO2 25 22 - 32 mmol/L   Glucose, Bld 66 (L) 70 - 99 mg/dL   BUN 13 6 - 20 mg/dL   Creatinine, Ser 9.02 0.61 - 1.24 mg/dL   Calcium  9.6 8.9 - 10.3 mg/dL   Total Protein 8.0 6.5 - 8.1 g/dL   Albumin 4.6 3.5 - 5.0 g/dL   AST 25 15 - 41 U/L   ALT 33 0 - 44 U/L   Alkaline Phosphatase 59 38 - 126 U/L   Total Bilirubin 1.7 (H) 0.0 - 1.2 mg/dL   GFR, Estimated >39 >39 mL/min   Anion gap 17 (H) 5 - 15   Unclear cause of lightheadedness. ECG: NSR.   Follow-up Information     Schedule an appointment as soon as possible for a visit  with Brien Charleston, MD.   Specialty: Family Medicine Why: For follow up. Contact information: [address] [place] [address] [phone]                 Reviewed expectations re: course of current medical issues. Questions answered. Outlined signs and symptoms indicating need for more acute intervention. Understanding verbalized. After Visit Summary given.   SUBJECTIVE: History from:  Patient. Randy Craig is a 44 y.o. male. PT reports for last 2 days he has had soreness and numbness to LT shoulder that goes down the arm. Pt does not remember any injury. Also complains of intermittent lightheadedness. None currently. Just feel lightheaded sometimes. Denies CP/palpitations. Normal PO intake; denies recent illness. No new medications.  OBJECTIVE:  Vitals:   12/06/24 1732  BP: (!) 144/84  Pulse: 87  Resp: 20  Temp: 98 F (36.7 C)  SpO2: 97%    General appearance: alert; no distress Eyes: PERRLA; EOMI; conjunctiva normal HENT: [place]; AT; without nasal congestion Neck: supple  CV: RRR Lungs: speaks full sentences without difficulty; unlabored Extremities: no edema; L shoulder with some pain on abduction; with FROM; without bony TTP Skin: warm and dry Neurologic: normal gait Psychological: alert and cooperative; normal mood and affect  Labs: Results for orders placed or performed during the hospital encounter of 12/06/24  CBC   Collection Time: 12/06/24  5:09 PM  Result Value Ref Range   WBC 8.0 4.0 - 10.5 K/uL   RBC 5.89 (H)  4.22 - 5.81 MIL/uL   Hemoglobin 15.3 13.0 - 17.0 g/dL   HCT 52.8 60.9 - 47.9 %   MCV 80.0 80.0 - 100.0 fL   MCH 26.0 26.0 - 34.0 pg   MCHC 32.5 30.0 - 36.0 g/dL   RDW 87.3 88.4 - 84.4 %   Platelets 260 150 - 400 K/uL   nRBC 0.0 0.0 - 0.2 %  Comprehensive metabolic panel with GFR   Collection Time: 12/06/24  5:09 PM  Result Value Ref Range   Sodium 140 135 - 145 mmol/L   Potassium 4.0 3.5 - 5.1 mmol/L   Chloride 98 98 - 111 mmol/L   CO2 25 22 - 32 mmol/L   Glucose, Bld 66 (L) 70 - 99 mg/dL   BUN 13 6 - 20 mg/dL   Creatinine, Ser 9.02 0.61 - 1.24 mg/dL   Calcium  9.6 8.9 - 10.3 mg/dL   Total Protein 8.0 6.5 - 8.1 g/dL   Albumin 4.6 3.5 - 5.0 g/dL   AST 25 15 - 41 U/L   ALT 33 0 - 44 U/L   Alkaline Phosphatase 59 38 - 126 U/L   Total Bilirubin 1.7 (H) 0.0 - 1.2 mg/dL   GFR, Estimated >39 >39 mL/min   Anion gap 17 (H) 5 - 15    Labs Reviewed  CBC - Abnormal; Notable for the following components:      Result Value   RBC 5.89 (*)    All other components within normal limits  COMPREHENSIVE METABOLIC PANEL WITH GFR - Abnormal; Notable for the following components:   Glucose, Bld 66 (*)    Total Bilirubin 1.7 (*)    Anion gap 17 (*)    All other components within normal limits    Imaging: No results found.  Allergies[1]  Past Medical History:  Diagnosis Date   ADHD    Aortic atherosclerosis    Bilateral buttock pain    GERD (gastroesophageal reflux disease)    Gynecomastia, male    Herpes simplex type 2 infection    Inattention    Obesity    Onychomycosis    Pure hypercholesterolemia    Right wrist pain 09/2013   Social History   Socioeconomic History   Marital status: Single    Spouse name: Not on file   Number of children: Not on file   Years of education: Not on file   Highest education level: Not on file  Occupational History   Not on file  Tobacco Use   Smoking status: Never   Smokeless tobacco: Never  Vaping Use   Vaping status: Never Used  Substance and Sexual Activity   Alcohol use: Yes    Comment: occasionally   Drug use: No   Sexual activity: Not on file  Other Topics Concern   Not on file  Social History Narrative   Not on file   Social Drivers of Health   Tobacco Use: Low Risk (09/19/2023)   Patient History    Smoking Tobacco Use: Never    Smokeless Tobacco Use: Never    Passive Exposure: Not on file  Financial Resource Strain: Not on file  Food Insecurity: Not on file  Transportation Needs: Not on file  Physical Activity: Not on file  Stress: Not on file  Social Connections: Not on file  Intimate Partner Violence: Not on file  Depression (EYV7-0): Not on file  Alcohol Screen: Not on file  Housing: Not on file  Utilities: Not on file  Health Literacy:  Not on file   Family History  Problem Relation Age of Onset   Hyperlipidemia Mother    Hypertension  Father    Heart disease Maternal Grandmother        CABG   Past Surgical History:  Procedure Laterality Date   NO PAST SURGERIES     WRIST ARTHROSCOPY WITH FOVEAL TRIANGULAR FIBROCARTILAGE COMPLEX REPAIR Left 10/09/2013   Procedure: RIGHT WRIST ARTHROSCOPY TFCC REPAIR    ;  Surgeon: Alm DELENA Hummer, MD;  Location: [facility];  Service: Orthopedics;  Laterality: Left;      [1] No Known Allergies    Rolinda Rogue, MD 12/07/24 1627  "

## 2024-12-08 ENCOUNTER — Ambulatory Visit (HOSPITAL_COMMUNITY): Payer: Self-pay

## 2024-12-20 ENCOUNTER — Institutional Professional Consult (permissible substitution) (HOSPITAL_BASED_OUTPATIENT_CLINIC_OR_DEPARTMENT_OTHER): Admitting: Internal Medicine
